# Patient Record
Sex: Male | Born: 1985 | Race: White | Hispanic: No | Marital: Single | Smoking: Former smoker
Health system: Southern US, Community
[De-identification: ages and names within clinical notes are randomized; demographics above are authoritative.]

## PROBLEM LIST (undated history)

## (undated) DIAGNOSIS — R569 Unspecified convulsions: Secondary | ICD-10-CM

---

## 2007-02-11 ENCOUNTER — Emergency Department (HOSPITAL_COMMUNITY): Admission: EM | Admit: 2007-02-11 | Discharge: 2007-02-11 | Payer: Self-pay | Admitting: Emergency Medicine

## 2009-11-02 ENCOUNTER — Inpatient Hospital Stay (HOSPITAL_COMMUNITY): Admission: EM | Admit: 2009-11-02 | Discharge: 2009-11-04 | Payer: Self-pay | Admitting: Emergency Medicine

## 2009-11-02 ENCOUNTER — Emergency Department (HOSPITAL_COMMUNITY): Admission: EM | Admit: 2009-11-02 | Discharge: 2009-11-02 | Payer: Self-pay | Admitting: Emergency Medicine

## 2010-03-25 ENCOUNTER — Emergency Department (HOSPITAL_COMMUNITY): Admission: EM | Admit: 2010-03-25 | Discharge: 2010-03-26 | Payer: Self-pay | Admitting: Emergency Medicine

## 2010-09-29 LAB — POCT I-STAT, CHEM 8
BUN: 6 mg/dL (ref 6–23)
Calcium, Ion: 0.98 mmol/L — ABNORMAL LOW (ref 1.12–1.32)
Chloride: 100 mEq/L (ref 96–112)
Creatinine, Ser: 1.2 mg/dL (ref 0.4–1.5)
HCT: 45 % (ref 39.0–52.0)
Potassium: 3.1 mEq/L — ABNORMAL LOW (ref 3.5–5.1)
TCO2: 23 mmol/L (ref 0–100)

## 2010-09-29 LAB — CBC
HCT: 40.4 % (ref 39.0–52.0)
Hemoglobin: 14.4 g/dL (ref 13.0–17.0)
MCHC: 35.6 g/dL (ref 30.0–36.0)
Platelets: 256 10*3/uL (ref 150–400)
RBC: 4.62 MIL/uL (ref 4.22–5.81)
WBC: 12.6 10*3/uL — ABNORMAL HIGH (ref 4.0–10.5)

## 2010-09-29 LAB — RAPID URINE DRUG SCREEN, HOSP PERFORMED
Amphetamines: NOT DETECTED
Benzodiazepines: NOT DETECTED
Cocaine: NOT DETECTED
Opiates: NOT DETECTED
Tetrahydrocannabinol: POSITIVE — AB

## 2010-09-29 LAB — COMPREHENSIVE METABOLIC PANEL
AST: 23 U/L (ref 0–37)
Alkaline Phosphatase: 93 U/L (ref 39–117)
Calcium: 8.7 mg/dL (ref 8.4–10.5)
Creatinine, Ser: 1.04 mg/dL (ref 0.4–1.5)
GFR calc Af Amer: >60 mL/min (ref >60)
Total Bilirubin: 0.7 mg/dL (ref 0.3–1.2)
Total Protein: 6.9 g/dL (ref 6.0–8.3)

## 2010-09-29 LAB — URINE MICROSCOPIC-ADD ON

## 2010-09-29 LAB — APTT: aPTT: 30 seconds (ref 24–37)

## 2010-09-29 LAB — PROTIME-INR: INR: 1.01 (ref 0.00–1.49)

## 2010-09-29 LAB — URINALYSIS, ROUTINE W REFLEX MICROSCOPIC
Bilirubin Urine: NEGATIVE
Ketones, ur: NEGATIVE mg/dL
Specific Gravity, Urine: 1.004 — ABNORMAL LOW (ref 1.005–1.030)

## 2010-09-29 LAB — ETHANOL: Alcohol, Ethyl (B): 222 mg/dL — ABNORMAL HIGH (ref 0–10)

## 2010-10-04 LAB — DIFFERENTIAL
Eosinophils Absolute: 0 10*3/uL (ref 0.0–0.7)
Monocytes Absolute: 1.2 10*3/uL — ABNORMAL HIGH (ref 0.1–1.0)
Monocytes Relative: 8 % (ref 3–12)
Neutrophils Relative %: 81 % — ABNORMAL HIGH (ref 43–77)

## 2010-10-04 LAB — HEPATIC FUNCTION PANEL
AST: 25 U/L (ref 0–37)
Albumin: 3.8 g/dL (ref 3.5–5.2)
Total Bilirubin: 0.7 mg/dL (ref 0.3–1.2)
Total Protein: 6.9 g/dL (ref 6.0–8.3)

## 2010-10-04 LAB — CBC
HCT: 40.3 % (ref 39.0–52.0)
Hemoglobin: 14.2 g/dL (ref 13.0–17.0)
MCV: 91 fL (ref 78.0–100.0)
RBC: 4.43 MIL/uL (ref 4.22–5.81)
WBC: 14.5 10*3/uL — ABNORMAL HIGH (ref 4.0–10.5)

## 2010-10-04 LAB — PROTIME-INR: INR: 1.07 (ref 0.00–1.49)

## 2010-10-04 LAB — ALDOSTERONE + RENIN ACTIVITY W/ RATIO: PRA LC/MS/MS: 2.73 ng/mL/h (ref 0.25–5.82)

## 2010-10-04 LAB — BASIC METABOLIC PANEL
GFR calc Af Amer: >60 mL/min (ref >60)
Glucose, Bld: 117 mg/dL — ABNORMAL HIGH (ref 70–99)
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
## 2011-05-01 LAB — URINALYSIS, ROUTINE W REFLEX MICROSCOPIC
Bilirubin Urine: NEGATIVE
Glucose, UA: NEGATIVE
Hgb urine dipstick: NEGATIVE
Ketones, ur: NEGATIVE
Nitrite: NEGATIVE
Protein, ur: NEGATIVE
Specific Gravity, Urine: 1.014
Urobilinogen, UA: 0.2
pH: 6

## 2014-01-17 ENCOUNTER — Emergency Department: Payer: Self-pay | Admitting: Emergency Medicine

## 2016-02-21 ENCOUNTER — Encounter (HOSPITAL_COMMUNITY): Payer: Self-pay | Admitting: Emergency Medicine

## 2016-02-21 ENCOUNTER — Emergency Department (HOSPITAL_COMMUNITY)
Admission: EM | Admit: 2016-02-21 | Discharge: 2016-02-21 | Disposition: A | Discharge status: Home / Self Care | Payer: Self-pay | Attending: Emergency Medicine | Admitting: Emergency Medicine

## 2016-02-21 DIAGNOSIS — Y999 Unspecified external cause status: Secondary | ICD-10-CM | POA: Insufficient documentation

## 2016-02-21 DIAGNOSIS — Z87891 Personal history of nicotine dependence: Secondary | ICD-10-CM | POA: Insufficient documentation

## 2016-02-21 DIAGNOSIS — Y939 Activity, unspecified: Secondary | ICD-10-CM | POA: Insufficient documentation

## 2016-02-21 DIAGNOSIS — Y929 Unspecified place or not applicable: Secondary | ICD-10-CM | POA: Insufficient documentation

## 2016-02-21 DIAGNOSIS — W57XXXA Bitten or stung by nonvenomous insect and other nonvenomous arthropods, initial encounter: Secondary | ICD-10-CM | POA: Insufficient documentation

## 2016-02-21 DIAGNOSIS — L03113 Cellulitis of right upper limb: Secondary | ICD-10-CM | POA: Insufficient documentation

## 2016-02-21 MED ORDER — IBUPROFEN 800 MG PO TABS: 800.0000 mg | ORAL_TABLET | Freq: Three times a day (TID) | ORAL | 0 refills | Status: DC

## 2016-02-21 MED ORDER — CEPHALEXIN 500 MG PO CAPS: 500.0000 mg | ORAL_CAPSULE | Freq: Four times a day (QID) | ORAL | 0 refills | Status: DC

## 2016-02-21 MED ORDER — SULFAMETHOXAZOLE-TRIMETHOPRIM 800-160 MG PO TABS: 1.0000 | ORAL_TABLET | Freq: Two times a day (BID) | ORAL | 0 refills | Status: AC

## 2016-02-21 NOTE — ED Triage Notes (Signed)
Patient complaining of abscess on right back of forearm. Patient does not know what bit him. He states this happened on Friday night. Patient arm is reddened with a black spot in the middle of the abscess.

## 2016-02-21 NOTE — Discharge Instructions (Signed)
Take the prescribed medication as directed. Keep wound clean and dry.  May wish to use warm compresses at home to help aid drainage/healing. Follow-up with your primary care doctor.  If you do not have one, you may wish to follow-up with the wellness clinic.  May call for appt. Return to the ED for new or worsening symptoms-- streaking up/down arm, high fever, worsening swelling, etc.

## 2016-02-21 NOTE — ED Provider Notes (Signed)
[facility]-EMERGENCY DEPT Provider Note   CSN: [id number] Arrival date & time: 02/21/16  4098  First Provider Contact:  First MD Initiated Contact with Patient 02/21/16 2037        History   Chief Complaint Chief Complaint  Patient presents with  . Abscess    HPI Daniel Terry is a 30 y.o. male.  The history is provided by the patient, the spouse and medical records.     30 year old male with no significant past medical history presenting to the ED for possible spider bite to his right forearm. He reports he felt this happened on Friday but did not see an insect directly. He states over the weekend. Has become increasingly more swollen and tender to the touch. He denies any fever or chills. States there has been some purulent drainage from the wound. He has no history of HIV, diabetes, or MRSA. He states he has been cleaning the area with peroxide and applying Neosporin without improvement.  History reviewed. No pertinent past medical history.  There are no active problems to display for this patient.   History reviewed. No pertinent surgical history.     Home Medications    Prior to Admission medications   Not on File    Family History History reviewed. No pertinent family history.  Social History Social History  Substance Use Topics  . Smoking status: Former Games developer  . Smokeless tobacco: Never Used  . Alcohol use No     Allergies   Review of patient's allergies indicates no known allergies.   Review of Systems Review of Systems  Skin: Positive for wound.  All other systems reviewed and are negative.    Physical Exam Updated Vital Signs BP 139/74 (BP Location: Right Arm)   Pulse 85   Temp 97.9 F (36.6 C) (Oral)   Resp 18   Ht  (1.753 m)   Wt 65.8 kg   SpO2 100%   BMI 21.41 kg/m   Physical Exam  Constitutional: He is oriented to person, place, and time. He appears well-developed and well-nourished.  HENT:  Head: Normocephalic and  atraumatic.  Mouth/Throat: Oropharynx is clear and moist.  Eyes: Conjunctivae and EOM are normal. Pupils are equal, round, and reactive to light.  Neck: Normal range of motion.  Cardiovascular: Normal rate, regular rhythm and normal heart sounds.   Pulmonary/Chest: Effort normal and breath sounds normal.  Abdominal: Soft. Bowel sounds are normal.  Musculoskeletal: Normal range of motion.  Right lower forearm with bug bite noted with small, central eschar; there is no fluctuance or active drainage; no bleeding; surrounding swelling, induration, and warmth to touch noted; no lymphangitis; full ROM of elbow, wrist, and all fingers; normal grip strength; normal sensation throughout  Neurological: He is alert and oriented to person, place, and time.  Skin: Skin is warm and dry.  Psychiatric: He has a normal mood and affect.  Nursing note and vitals reviewed.    ED Treatments / Results  Labs (all labs ordered are listed, but only abnormal results are displayed) Labs Reviewed - No data to display  EKG  EKG Interpretation None       Radiology No results found.  Procedures Procedures (including critical care time)  Medications Ordered in ED Medications - No data to display   Initial Impression / Assessment and Plan / ED Course  I have reviewed the triage vital signs and the nursing notes.  Pertinent labs & imaging results that were available during my care of  the patient were reviewed by me and considered in my medical decision making (see chart for details).  Clinical Course   30 year old male here with insect bite to right volar forearm with surrounding cellulitis. There is no active drainage or bleeding currently. Full range of motion of elbow, wrist, and all fingers.  Arm is neurovascularly intact. Do not clinically suspect septic joint.  Patient is afebrile and nontoxic. Will start on combination of Bactrim and Keflex. Encouraged to continue home wound care, monitor closely  at home for any worsening infection.  Discussed plan with patient, he acknowledged understanding and agreed with plan of care.  Return precautions given for new or worsening symptoms.  Final Clinical Impressions(s) / ED Diagnoses   Final diagnoses:  Insect bite  Cellulitis of right upper extremity    New Prescriptions Discharge Medication List as of 02/21/2016  9:08 PM    START taking these medications   Details  cephALEXin (KEFLEX) 500 MG capsule Take 1 capsule (500 mg total) by mouth 4 (four) times daily., Starting Mon 02/21/2016, Print    ibuprofen (ADVIL,MOTRIN) 800 MG tablet Take 1 tablet (800 mg total) by mouth 3 (three) times daily., Starting Mon 02/21/2016, Print    sulfamethoxazole-trimethoprim (BACTRIM DS,SEPTRA DS) 800-160 MG tablet Take 1 tablet by mouth 2 (two) times daily., Starting Mon 02/21/2016, Until Thu 03/02/2016, Print         Garlon HatchetLisa M Adrielle Polakowski, PA-C 02/21/16 2139    Lorre NickAnthony Allen, MD 02/25/16 1539

## 2016-02-21 NOTE — Progress Notes (Signed)
Pt stated he was bitten by something on his right under part of his arm.Pt does have a blackened scabbed area and warmth and reddness at the site.

## 2018-05-08 ENCOUNTER — Other Ambulatory Visit: Payer: Self-pay

## 2018-05-08 ENCOUNTER — Emergency Department: Payer: Self-pay

## 2018-05-08 ENCOUNTER — Emergency Department
Admission: EM | Admit: 2018-05-08 | Discharge: 2018-05-08 | Disposition: A | Discharge status: Home / Self Care | Payer: Self-pay | Attending: Emergency Medicine | Admitting: Emergency Medicine

## 2018-05-08 DIAGNOSIS — Z87891 Personal history of nicotine dependence: Secondary | ICD-10-CM | POA: Insufficient documentation

## 2018-05-08 DIAGNOSIS — R569 Unspecified convulsions: Secondary | ICD-10-CM | POA: Insufficient documentation

## 2018-05-08 LAB — COMPREHENSIVE METABOLIC PANEL
ALK PHOS: 75 U/L (ref 38–126)
ALT: 18 U/L (ref 0–44)
AST: 30 U/L (ref 15–41)
Albumin: 4.7 g/dL (ref 3.5–5.0)
Anion gap: 14 (ref 5–15)
BILIRUBIN TOTAL: 0.4 mg/dL (ref 0.3–1.2)
BUN: 15 mg/dL (ref 6–20)
CALCIUM: 8.8 mg/dL — AB (ref 8.9–10.3)
CO2: 22 mmol/L (ref 22–32)
Chloride: 104 mmol/L (ref 98–111)
Creatinine, Ser: 0.97 mg/dL (ref 0.61–1.24)
GFR calc Af Amer: >60 mL/min (ref >60)
GLUCOSE: 133 mg/dL — AB (ref 70–99)
Potassium: 3.6 mmol/L (ref 3.5–5.1)
Sodium: 140 mmol/L (ref 135–145)
TOTAL PROTEIN: 7.6 g/dL (ref 6.5–8.1)

## 2018-05-08 LAB — CBC WITH DIFFERENTIAL/PLATELET
Abs Immature Granulocytes: 0.05 10*3/uL (ref 0.00–0.07)
Basophils Absolute: 0.1 10*3/uL (ref 0.0–0.1)
Basophils Relative: 1 %
EOS ABS: 0.4 10*3/uL (ref 0.0–0.5)
EOS PCT: 5 %
HCT: 42.4 % (ref 39.0–52.0)
HEMOGLOBIN: 14.6 g/dL (ref 13.0–17.0)
Immature Granulocytes: 1 %
LYMPHS ABS: 3.4 10*3/uL (ref 0.7–4.0)
LYMPHS PCT: 35 %
MCH: 30.6 pg (ref 26.0–34.0)
MCHC: 34.4 g/dL (ref 30.0–36.0)
MCV: 88.9 fL (ref 80.0–100.0)
MONO ABS: 0.7 10*3/uL (ref 0.1–1.0)
MONOS PCT: 8 %
NRBC: 0 % (ref 0.0–0.2)
Neutro Abs: 4.9 10*3/uL (ref 1.7–7.7)
Neutrophils Relative %: 50 %
Platelets: 307 10*3/uL (ref 150–400)
RBC: 4.77 MIL/uL (ref 4.22–5.81)
RDW: 12.2 % (ref 11.5–15.5)
WBC: 9.6 10*3/uL (ref 4.0–10.5)

## 2018-05-08 LAB — URINE DRUG SCREEN, QUALITATIVE (ARMC ONLY)
Amphetamines, Ur Screen: NOT DETECTED
BARBITURATES, UR SCREEN: NOT DETECTED
Benzodiazepine, Ur Scrn: NOT DETECTED
CANNABINOID 50 NG, UR ~~LOC~~: POSITIVE — AB
COCAINE METABOLITE, UR ~~LOC~~: NOT DETECTED
MDMA (ECSTASY) UR SCREEN: NOT DETECTED
METHADONE SCREEN, URINE: NOT DETECTED
OPIATE, UR SCREEN: POSITIVE — AB
Phencyclidine (PCP) Ur S: NOT DETECTED
TRICYCLIC, UR SCREEN: NOT DETECTED

## 2018-05-08 LAB — URINALYSIS, ROUTINE W REFLEX MICROSCOPIC
BACTERIA UA: NONE SEEN
Bilirubin Urine: NEGATIVE
Glucose, UA: NEGATIVE mg/dL
Hgb urine dipstick: NEGATIVE
KETONES UR: NEGATIVE mg/dL
LEUKOCYTES UA: NEGATIVE
Nitrite: NEGATIVE
PROTEIN: 30 mg/dL — AB
SQUAMOUS EPITHELIAL / LPF: NONE SEEN (ref 0–5)
Specific Gravity, Urine: 1.018 (ref 1.005–1.030)
pH: 6 (ref 5.0–8.0)

## 2018-05-08 LAB — ACETAMINOPHEN LEVEL

## 2018-05-08 LAB — SALICYLATE LEVEL: Salicylate Lvl: <7 mg/dL (ref 2.8–30.0)

## 2018-05-08 LAB — LACTIC ACID, PLASMA
Lactic Acid, Venous: 4.7 mmol/L (ref 0.5–1.9)
Lactic Acid, Venous: 1.2 mmol/L (ref 0.5–1.9)

## 2018-05-08 LAB — GLUCOSE, CAPILLARY: Glucose-Capillary: 116 mg/dL — ABNORMAL HIGH (ref 70–99)

## 2018-05-08 LAB — MAGNESIUM: MAGNESIUM: 2 mg/dL (ref 1.7–2.4)

## 2018-05-08 LAB — ETHANOL: Alcohol, Ethyl (B): <10 mg/dL (ref <10)

## 2018-05-08 MED ORDER — ONDANSETRON HCL 4 MG/2ML IJ SOLN
4.0000 mg | INTRAMUSCULAR | Status: AC
Start: 2018-05-08 — End: 2018-05-08
  Administered 2018-05-08: 4 mg via INTRAVENOUS

## 2018-05-08 MED ORDER — SODIUM CHLORIDE 0.9 % IV BOLUS: 1000.0000 mL | Freq: Once | INTRAVENOUS | Status: DC

## 2018-05-08 MED ORDER — MORPHINE SULFATE (PF) 4 MG/ML IV SOLN
INTRAVENOUS | Status: AC
  Filled 2018-05-08: qty 1

## 2018-05-08 MED ORDER — ONDANSETRON HCL 4 MG/2ML IJ SOLN
INTRAMUSCULAR | Status: AC
  Filled 2018-05-08: qty 2

## 2018-05-08 MED ORDER — MORPHINE SULFATE (PF) 4 MG/ML IV SOLN
4.0000 mg | Freq: Once | INTRAVENOUS | Status: AC
  Administered 2018-05-08: 4 mg via INTRAVENOUS
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
## 2018-05-08 NOTE — ED Notes (Signed)
Patient complaining of headache and clutching forehead. Patient denies drug or alcohol abuse and is now alert and oriented.

## 2018-05-08 NOTE — ED Notes (Signed)
Patient's Father Daniel Terry); [phone]

## 2018-05-08 NOTE — Discharge Instructions (Signed)
You might have had a seizure. No driving until cleared by neurology   Call neurology for appointment in a week   Avoid using marijuana or alcohol   Return to ER if you have another seizure, lethargy

## 2018-05-08 NOTE — ED Triage Notes (Signed)
Patient coming in from home via [facility] that gf called because patient was having seizure like activity in bed. EMS states he was running around house and has no hx of seizures that they know of. Oriented to self and place.

## 2019-06-04 ENCOUNTER — Emergency Department (HOSPITAL_COMMUNITY)
Admission: EM | Admit: 2019-06-04 | Discharge: 2019-06-04 | Disposition: A | Discharge status: Home / Self Care | Payer: Self-pay | Attending: Emergency Medicine | Admitting: Emergency Medicine

## 2019-06-04 ENCOUNTER — Encounter (HOSPITAL_COMMUNITY): Payer: Self-pay

## 2019-06-04 DIAGNOSIS — Z87891 Personal history of nicotine dependence: Secondary | ICD-10-CM | POA: Insufficient documentation

## 2019-06-04 DIAGNOSIS — R569 Unspecified convulsions: Secondary | ICD-10-CM | POA: Insufficient documentation

## 2019-06-04 LAB — BASIC METABOLIC PANEL
Anion gap: 9 (ref 5–15)
BUN: 12 mg/dL (ref 6–20)
CO2: 24 mmol/L (ref 22–32)
Calcium: 9.1 mg/dL (ref 8.9–10.3)
Chloride: 102 mmol/L (ref 98–111)
Creatinine, Ser: 1.14 mg/dL (ref 0.61–1.24)
GFR calc Af Amer: >60 mL/min (ref >60)
GFR calc non Af Amer: >60 mL/min (ref >60)
Glucose, Bld: 161 mg/dL — ABNORMAL HIGH (ref 70–99)
Potassium: 3.4 mmol/L — ABNORMAL LOW (ref 3.5–5.1)
Sodium: 135 mmol/L (ref 135–145)

## 2019-06-04 LAB — CBC WITH DIFFERENTIAL/PLATELET
Abs Immature Granulocytes: 0.08 10*3/uL — ABNORMAL HIGH (ref 0.00–0.07)
Basophils Absolute: 0.1 10*3/uL (ref 0.0–0.1)
Basophils Relative: 1 %
Eosinophils Absolute: 0.1 10*3/uL (ref 0.0–0.5)
Eosinophils Relative: 1 %
HCT: 44.4 % (ref 39.0–52.0)
Hemoglobin: 14.6 g/dL (ref 13.0–17.0)
Immature Granulocytes: 1 %
Lymphocytes Relative: 6 %
Lymphs Abs: 1 10*3/uL (ref 0.7–4.0)
MCH: 30.4 pg (ref 26.0–34.0)
MCHC: 32.9 g/dL (ref 30.0–36.0)
MCV: 92.5 fL (ref 80.0–100.0)
Monocytes Absolute: 0.7 10*3/uL (ref 0.1–1.0)
Monocytes Relative: 5 %
Neutro Abs: 13.4 10*3/uL — ABNORMAL HIGH (ref 1.7–7.7)
Neutrophils Relative %: 86 %
Platelets: 325 10*3/uL (ref 150–400)
RBC: 4.8 MIL/uL (ref 4.22–5.81)
RDW: 12.4 % (ref 11.5–15.5)
WBC: 15.3 10*3/uL — ABNORMAL HIGH (ref 4.0–10.5)
nRBC: 0 % (ref 0.0–0.2)

## 2019-06-04 LAB — CBG MONITORING, ED: Glucose-Capillary: 152 mg/dL — ABNORMAL HIGH (ref 70–99)

## 2019-06-04 NOTE — ED Provider Notes (Signed)
[place] DEPT Provider Note   CSN: [id number] Arrival date & time: 06/04/19  5366     History   Chief Complaint seizure  HPI Daniel Terry is a 33 y.o. male.     HPI Pt presents to the ED after a seizure.  Pt remembers going to bed last night.   _Pt does not recall what happened.  EMS reports girlfriend noted grand mal seizure.  Pt denies any complaints right now other than being thirsty.  Pt states he did do some "powder" recently.  Drinks alcohol a few times per week.  Denies excessive use.  Prior history of seizures.  Was referred to a neurologist but did not make an appointment.  No seizure in last year. History reviewed. No pertinent past medical history.  There are no active problems to display for this patient.   History reviewed. No pertinent surgical history.      Home Medications    Prior to Admission medications   Not on File    Family History No family history on file.  Social History Social History   Tobacco Use  . Smoking status: Former Research scientist (life sciences)  . Smokeless tobacco: Never Used  Substance Use Topics  . Alcohol use: No  . Drug use: Not on file     Allergies   Patient has no known allergies.   Review of Systems Review of Systems  Constitutional: Negative for fever.  Respiratory: Negative for shortness of breath.   Cardiovascular: Negative for chest pain.  Gastrointestinal: Negative for abdominal pain.  Neurological: Positive for headaches ( mild). Negative for speech difficulty and weakness.  All other systems reviewed and are negative.    Physical Exam Updated Vital Signs BP (!) 156/92   Pulse 92   Temp 97.7 F (36.5 C)   Resp 20   Ht 1.753 m (5\' 9" )   Wt 63.5 kg   SpO2 100%   BMI 20.67 kg/m   Physical Exam Vitals signs and nursing note reviewed.  Constitutional:      General: He is not in acute distress.    Appearance: He is well-developed.  HENT:     Head: Normocephalic and  atraumatic.     Right Ear: External ear normal.     Left Ear: External ear normal.  Eyes:     General: No scleral icterus.       Right eye: No discharge.        Left eye: No discharge.     Conjunctiva/sclera: Conjunctivae normal.  Neck:     Musculoskeletal: Neck supple.     Trachea: No tracheal deviation.  Cardiovascular:     Rate and Rhythm: Normal rate and regular rhythm.  Pulmonary:     Effort: Pulmonary effort is normal. No respiratory distress.     Breath sounds: Normal breath sounds. No stridor. No wheezing or rales.  Abdominal:     General: Bowel sounds are normal. There is no distension.     Palpations: Abdomen is soft.     Tenderness: There is no abdominal tenderness. There is no guarding or rebound.  Musculoskeletal:        General: No tenderness.  Skin:    General: Skin is warm and dry.     Findings: No rash.  Neurological:     Mental Status: He is alert and oriented to person, place, and time.     Cranial Nerves: No cranial nerve deficit (no facial droop, extraocular movements intact, no slurred speech).  Sensory: No sensory deficit.     Motor: No abnormal muscle tone or seizure activity.     Coordination: Coordination normal.     Comments: No pronator drift bilateral upper extrem, able to hold both legs off bed for 5 seconds, sensation intact in all extremities, no visual field cuts, no left or right sided neglect,  no nystagmus noted       ED Treatments / Results  Labs (all labs ordered are listed, but only abnormal results are displayed) Labs Reviewed  BASIC METABOLIC PANEL - Abnormal; Notable for the following components:      Result Value   Potassium 3.4 (*)    Glucose, Bld 161 (*)    All other components within normal limits  CBC WITH DIFFERENTIAL/PLATELET - Abnormal; Notable for the following components:   WBC 15.3 (*)    Neutro Abs 13.4 (*)    Abs Immature Granulocytes 0.08 (*)    All other components within normal limits  CBG MONITORING, ED -  Abnormal; Notable for the following components:   Glucose-Capillary 152 (*)    All other components within normal limits    EKG None  Radiology No results found.  Procedures Procedures (including critical care time)  Medications Ordered in ED Medications - No data to display   Initial Impression / Assessment and Plan / ED Course  I have reviewed the triage vital signs and the nursing notes.  Pertinent labs & imaging results that were available during my care of the patient were reviewed by me and considered in my medical decision making (see chart for details).  Clinical Course as of Jun 03 1038  Wed Jun 04, 2019  1039 Patient was monitored in the ED.  No recurrent seizures.  He is at his mental baseline.   [JK]    Clinical Course User Index [JK] Linwood Dibbles, MD     Patient presented to the ED after seizure.  Patient does admit to recent drug use last evening.  In the past this did precede his prior seizure as well.  Patient is at his mental baseline.  No neurologic abnormalities.  I doubt hemorrhage, tumor or infection.  I do think the patient would benefit from outpatient neurology follow-up.  Discussed avoiding being at heights or driving or any other dangerous activities where if he had a seizure he could be injured.  Outpatient follow-up discussed with the patient as well as his significant other.  Final Clinical Impressions(s) / ED Diagnoses   Final diagnoses:  Seizure [facility])    ED Discharge Orders    None       Linwood Dibbles, MD 06/04/19 1040

## 2019-06-04 NOTE — ED Triage Notes (Addendum)
Per EMS: Pt from home.  Pt's girlfriend called stating pt had a grand-mal like seizure.  Pt does not remember event.  Pt states he has had one seizure previous to this event.  No pt hx of seizures. Pt given 4 mg zofran IV 20 gauge LAC BP 138/91 CBG 147 HR 97 O2 96%

## 2019-06-04 NOTE — Discharge Instructions (Addendum)
Follow up with a neurologist for further evaluation as we discussed.  Avoid driving, being at heights until being evaluated by a neurologist

## 2019-11-21 ENCOUNTER — Other Ambulatory Visit: Payer: Self-pay

## 2019-11-21 ENCOUNTER — Observation Stay (HOSPITAL_COMMUNITY): Payer: Self-pay

## 2019-11-21 ENCOUNTER — Encounter (HOSPITAL_COMMUNITY): Payer: Self-pay | Admitting: Internal Medicine

## 2019-11-21 ENCOUNTER — Observation Stay (HOSPITAL_COMMUNITY)
Admission: EM | Admit: 2019-11-21 | Discharge: 2019-11-22 | Disposition: A | Discharge status: Home / Self Care | Payer: Self-pay | Attending: Internal Medicine | Admitting: Internal Medicine

## 2019-11-21 DIAGNOSIS — R569 Unspecified convulsions: Principal | ICD-10-CM

## 2019-11-21 DIAGNOSIS — Z87891 Personal history of nicotine dependence: Secondary | ICD-10-CM | POA: Insufficient documentation

## 2019-11-21 DIAGNOSIS — F10939 Alcohol use, unspecified with withdrawal, unspecified: Secondary | ICD-10-CM

## 2019-11-21 DIAGNOSIS — F1020 Alcohol dependence, uncomplicated: Secondary | ICD-10-CM | POA: Insufficient documentation

## 2019-11-21 DIAGNOSIS — F10239 Alcohol dependence with withdrawal, unspecified: Secondary | ICD-10-CM

## 2019-11-21 DIAGNOSIS — Z20822 Contact with and (suspected) exposure to covid-19: Secondary | ICD-10-CM | POA: Insufficient documentation

## 2019-11-21 LAB — HIV ANTIBODY (ROUTINE TESTING W REFLEX): HIV Screen 4th Generation wRfx: NONREACTIVE

## 2019-11-21 LAB — RESPIRATORY PANEL BY RT PCR (FLU A&B, COVID)
Influenza A by PCR: NEGATIVE
Influenza B by PCR: NEGATIVE
SARS Coronavirus 2 by RT PCR: NEGATIVE

## 2019-11-21 LAB — RAPID URINE DRUG SCREEN, HOSP PERFORMED
Amphetamines: NOT DETECTED
Barbiturates: NOT DETECTED
Benzodiazepines: NOT DETECTED
Cocaine: NOT DETECTED
Opiates: NOT DETECTED
Tetrahydrocannabinol: POSITIVE — AB

## 2019-11-21 LAB — URINALYSIS, ROUTINE W REFLEX MICROSCOPIC
Bilirubin Urine: NEGATIVE
Glucose, UA: NEGATIVE mg/dL
Ketones, ur: NEGATIVE mg/dL
Leukocytes,Ua: NEGATIVE
Nitrite: NEGATIVE
Protein, ur: 100 mg/dL — AB
Specific Gravity, Urine: 1.017 (ref 1.005–1.030)
pH: 5 (ref 5.0–8.0)

## 2019-11-21 LAB — CBG MONITORING, ED: Glucose-Capillary: 141 mg/dL — ABNORMAL HIGH (ref 70–99)

## 2019-11-21 LAB — BASIC METABOLIC PANEL
Anion gap: 12 (ref 5–15)
BUN: 10 mg/dL (ref 6–20)
CO2: 23 mmol/L (ref 22–32)
Calcium: 9.2 mg/dL (ref 8.9–10.3)
Chloride: 103 mmol/L (ref 98–111)
Creatinine, Ser: 1.21 mg/dL (ref 0.61–1.24)
GFR calc Af Amer: >60 mL/min (ref >60)
GFR calc non Af Amer: >60 mL/min (ref >60)
Glucose, Bld: 135 mg/dL — ABNORMAL HIGH (ref 70–99)
Potassium: 3.8 mmol/L (ref 3.5–5.1)
Sodium: 138 mmol/L (ref 135–145)

## 2019-11-21 LAB — CBC
HCT: 46 % (ref 39.0–52.0)
Hemoglobin: 15.7 g/dL (ref 13.0–17.0)
MCH: 31 pg (ref 26.0–34.0)
MCHC: 34.1 g/dL (ref 30.0–36.0)
MCV: 90.9 fL (ref 80.0–100.0)
Platelets: 321 10*3/uL (ref 150–400)
RBC: 5.06 MIL/uL (ref 4.22–5.81)
RDW: 12.2 % (ref 11.5–15.5)
WBC: 14.5 10*3/uL — ABNORMAL HIGH (ref 4.0–10.5)
nRBC: 0 % (ref 0.0–0.2)

## 2019-11-21 LAB — TSH: TSH: 1.471 u[IU]/mL (ref 0.350–4.500)

## 2019-11-21 LAB — ETHANOL: Alcohol, Ethyl (B): <10 mg/dL (ref <10)

## 2019-11-21 LAB — MAGNESIUM: Magnesium: 2.5 mg/dL — ABNORMAL HIGH (ref 1.7–2.4)

## 2019-11-21 LAB — CK: Total CK: 135 U/L (ref 49–397)

## 2019-11-21 LAB — PHOSPHORUS: Phosphorus: 1.3 mg/dL — ABNORMAL LOW (ref 2.5–4.6)

## 2019-11-21 MED ORDER — ENOXAPARIN SODIUM 40 MG/0.4ML ~~LOC~~ SOLN
40.0000 mg | SUBCUTANEOUS | Status: DC
  Administered 2019-11-21: 40 mg via SUBCUTANEOUS
  Filled 2019-11-21: qty 0.4

## 2019-11-21 MED ORDER — SODIUM CHLORIDE 0.9 % IV SOLN
2000.0000 mg | Freq: Once | INTRAVENOUS | Status: AC
  Administered 2019-11-21: 2000 mg via INTRAVENOUS
  Filled 2019-11-21: qty 20

## 2019-11-21 MED ORDER — SODIUM CHLORIDE 0.9 % IV SOLN
75.0000 mL/h | INTRAVENOUS | Status: DC
  Administered 2019-11-21: 75 mL/h via INTRAVENOUS

## 2019-11-21 MED ORDER — THIAMINE HCL 100 MG/ML IJ SOLN
100.0000 mg | Freq: Every day | INTRAMUSCULAR | Status: DC
  Administered 2019-11-21: 17:00:00 100 mg via INTRAVENOUS
  Filled 2019-11-21: qty 2

## 2019-11-21 MED ORDER — LORAZEPAM 2 MG/ML IJ SOLN
1.0000 mg | INTRAMUSCULAR | Status: DC | PRN
  Administered 2019-11-21: 2 mg via INTRAVENOUS

## 2019-11-21 MED ORDER — LORAZEPAM 1 MG PO TABS: 1.0000 mg | ORAL_TABLET | ORAL | Status: DC | PRN

## 2019-11-21 MED ORDER — LORAZEPAM 2 MG/ML IJ SOLN
1.0000 mg | INTRAMUSCULAR | Status: DC | PRN
  Filled 2019-11-21: qty 1

## 2019-11-21 MED ORDER — ADULT MULTIVITAMIN W/MINERALS CH
1.0000 | ORAL_TABLET | Freq: Every day | ORAL | Status: DC
  Administered 2019-11-22: 1 via ORAL
  Filled 2019-11-21: qty 1

## 2019-11-21 MED ORDER — LORAZEPAM 2 MG/ML IJ SOLN
1.0000 mg | Freq: Once | INTRAMUSCULAR | Status: AC
  Administered 2019-11-21: 12:00:00 1 mg via INTRAVENOUS
  Filled 2019-11-21: qty 1

## 2019-11-21 MED ORDER — THIAMINE HCL 100 MG PO TABS
100.0000 mg | ORAL_TABLET | Freq: Every day | ORAL | Status: DC
  Administered 2019-11-22: 10:00:00 100 mg via ORAL
  Filled 2019-11-21: qty 1

## 2019-11-21 MED ORDER — LEVETIRACETAM 500 MG PO TABS
500.0000 mg | ORAL_TABLET | Freq: Two times a day (BID) | ORAL | Status: DC
  Administered 2019-11-22: 500 mg via ORAL
  Filled 2019-11-21: qty 1

## 2019-11-21 MED ORDER — FOLIC ACID 1 MG PO TABS
1.0000 mg | ORAL_TABLET | Freq: Every day | ORAL | Status: DC
  Administered 2019-11-22: 1 mg via ORAL
  Filled 2019-11-21: qty 1

## 2019-11-21 MED ORDER — LORAZEPAM 2 MG/ML IJ SOLN
INTRAMUSCULAR | Status: AC
  Administered 2019-11-21: 1 mg via INTRAVENOUS
  Filled 2019-11-21: qty 1

## 2019-11-21 MED ORDER — LIDOCAINE VISCOUS HCL 2 % MT SOLN
15.0000 mL | OROMUCOSAL | Status: DC | PRN
  Filled 2019-11-21: qty 15

## 2019-11-21 NOTE — ED Notes (Signed)
Pt returned from MRI at this time. Placed pt back on monitor, pt placed in position of comfort and advised of wait status.   
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
## 2019-11-21 NOTE — ED Notes (Signed)
tammy Sky, mother- [phone]- would like a pt update.
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
## 2019-11-21 NOTE — Procedures (Signed)
Patient Name: CHEICK SUHR  MRN: [id number]  Epilepsy Attending: Charlsie Quest  Referring Physician/Provider: Dr Lenward Chancellor Date: 11/21/2019 Duration: 23.44 mins  Patient history: 34yo M with h/o alcohol use presented with multiple seizure like episodes.   Level of alertness: awake, asleep  AEDs during EEG study: LEV, Lorazepam  Technical aspects: This EEG study was done with scalp electrodes positioned according to the 10-20 International system of electrode placement. Electrical activity was acquired at a sampling rate of 500Hz  and reviewed with a high frequency filter of 70Hz  and a low frequency filter of 1Hz . EEG data were recorded continuously and digitally stored.   DESCRIPTION: The posterior dominant rhythm consists of 8-9 Hz activity of moderate voltage (25-35 uV) seen predominantly in posterior head regions, symmetric and reactive to eye opening and eye closing. Sleep was characterized by vertex waves, sleep spindles (12-14hz ), maximal frontocentral region. Hyperventilation and photic stimulation were not performed.  IMPRESSION: This study is within normal limits. No seizures or epileptiform discharges were seen throughout the recording.  Kynley Metzger 

## 2019-11-21 NOTE — ED Notes (Signed)
Pt transported to MRI at this time 

## 2019-11-21 NOTE — Progress Notes (Signed)
EEG complete - results pending 

## 2019-11-21 NOTE — Progress Notes (Signed)
Patient was not able to continue with the rest of his MRI exam. I heard yelling about halfway through and the patient has climbed out of the scanner while the exam was going. I told him He stated he cannot take it anymore and needs to get out of here. Limited MRI exam. Sent over the images that I did end up getting. If MD wants further imaging, patient will need to be medicated before coming down.

## 2019-11-21 NOTE — ED Notes (Addendum)
States he feels sx every morning, of anxiety and increased stress, sweats and dizziness.  Denies any N/V in AM.  Pt calm and pleasant.

## 2019-11-21 NOTE — ED Triage Notes (Signed)
Reportedly x 4 seizures witnessed by GF over the last 24 hours, generalized shaking lasting only a minute.  Has had seizures in the past and has no official diagnosis or on any meds.  Pt denies any incontinence, did bite tongue during the episodes.  Alert and oriented on arrival.  Does admit to ETOH use at least every other day, 12 beers.  Last intake 2 days ago, and states he drank all day.  Does have lower back pain at a "5", no other injuries noted.  Pt has had referral to a Neurologist 1 month ago when he had his last episode, but did not follow through.

## 2019-11-22 LAB — COMPREHENSIVE METABOLIC PANEL
ALT: 22 U/L (ref 0–44)
AST: 24 U/L (ref 15–41)
Albumin: 3.6 g/dL (ref 3.5–5.0)
Alkaline Phosphatase: 80 U/L (ref 38–126)
Anion gap: 10 (ref 5–15)
BUN: 8 mg/dL (ref 6–20)
CO2: 24 mmol/L (ref 22–32)
Calcium: 8.7 mg/dL — ABNORMAL LOW (ref 8.9–10.3)
Chloride: 103 mmol/L (ref 98–111)
Creatinine, Ser: 1.16 mg/dL (ref 0.61–1.24)
GFR calc Af Amer: >60 mL/min (ref >60)
GFR calc non Af Amer: >60 mL/min (ref >60)
Glucose, Bld: 100 mg/dL — ABNORMAL HIGH (ref 70–99)
Potassium: 3.3 mmol/L — ABNORMAL LOW (ref 3.5–5.1)
Sodium: 137 mmol/L (ref 135–145)
Total Bilirubin: 1.3 mg/dL — ABNORMAL HIGH (ref 0.3–1.2)
Total Protein: 6.7 g/dL (ref 6.5–8.1)

## 2019-11-22 LAB — CBC
HCT: 41.2 % (ref 39.0–52.0)
Hemoglobin: 14.3 g/dL (ref 13.0–17.0)
MCH: 31.5 pg (ref 26.0–34.0)
MCHC: 34.7 g/dL (ref 30.0–36.0)
MCV: 90.7 fL (ref 80.0–100.0)
Platelets: 279 10*3/uL (ref 150–400)
RBC: 4.54 MIL/uL (ref 4.22–5.81)
RDW: 12.4 % (ref 11.5–15.5)
WBC: 13.6 10*3/uL — ABNORMAL HIGH (ref 4.0–10.5)
nRBC: 0 % (ref 0.0–0.2)

## 2019-11-22 MED ORDER — LIDOCAINE VISCOUS HCL 2 % MT SOLN: 15.0000 mL | OROMUCOSAL | 0 refills | Status: DC | PRN

## 2019-11-22 MED ORDER — NALTREXONE HCL 50 MG PO TABS
50.0000 mg | ORAL_TABLET | Freq: Every day | ORAL | 1 refills | Status: DC
Start: 2019-11-22 — End: 2020-08-07

## 2019-11-22 MED ORDER — LEVETIRACETAM 500 MG PO TABS: 500.0000 mg | ORAL_TABLET | Freq: Two times a day (BID) | ORAL | 1 refills | Status: DC

## 2019-11-22 MED ORDER — POTASSIUM CHLORIDE CRYS ER 20 MEQ PO TBCR
30.0000 meq | EXTENDED_RELEASE_TABLET | Freq: Two times a day (BID) | ORAL | Status: DC
  Administered 2019-11-22: 30 meq via ORAL
  Filled 2019-11-22: qty 1

## 2019-11-22 MED ORDER — K PHOS MONO-SOD PHOS DI & MONO 155-852-130 MG PO TABS
500.0000 mg | ORAL_TABLET | ORAL | Status: DC
  Administered 2019-11-22: 500 mg via ORAL
  Filled 2019-11-22 (×4): qty 2

## 2019-11-22 NOTE — TOC Initial Note (Signed)
Transition of Care [facility]) - Initial/Assessment Note    Patient Details  Name: Daniel Terry MRN: [id number] Date of Birth: 1986/01/28  Transition of Care [facility]) CM/SW Contact:    Lawerance Sabal, RN Phone Number: 11/22/2019, 11:39 AM  Clinical Narrative:              Daniel Terry w patient and girlfriend at bedside. Discussed plan for home meds.  Patient to get meds filled at [facility]. He is going to sign up for the Rx Saving Program (managed through [employer]). We reviewed the cost of this. He will also use [employer]. App for [employer], and [employer] Rx saving plan sent to girlfriend's phone at [phone]. Both verbalized understanding of plan and were grateful for help.  IM clinic on AVS for them to call Terry to schedule follow up.            Patient Goals and CMS Choice        Expected Discharge Plan and Services           Expected Discharge Date: 11/22/19                                    Prior Living Arrangements/Services                       Activities of Daily Living Home Assistive Devices/Equipment: None ADL Screening (condition at time of admission) Patient's cognitive ability adequate to safely complete daily activities?: Yes Is the patient deaf or have difficulty hearing?: No Does the patient have difficulty seeing, even when wearing glasses/contacts?: No Does the patient have difficulty concentrating, remembering, or making decisions?: No Patient able to express need for assistance with ADLs?: Yes Does the patient have difficulty dressing or bathing?: No Independently performs ADLs?: Yes (appropriate for developmental age) Does the patient have difficulty walking or climbing stairs?: No Weakness of Legs: None Weakness of Arms/Hands: None  Permission Sought/Granted                  Emotional Assessment              Admission diagnosis:  Seizure [facility]) [R56.9] Patient Active Problem List   Diagnosis Date Noted  . Seizure (HCC)  11/21/2019   PCP:  Patient, No Pcp Per Pharmacy:   [facility] [phone] Hassell Halim [address] [place] [address] Phone: [phone] Fax: [phone]  [facility] [place] [address] [place] [address] [address] [place] [address] Phone: [phone] Fax: [phone]  [facility] [address] [place] - [address] [place] [address] Phone: [phone] Fax: [phone]     Social Determinants of Health (SDOH) Interventions    Readmission Risk Interventions No flowsheet data found.
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
## 2019-12-08 ENCOUNTER — Telehealth: Payer: Self-pay | Admitting: *Deleted

## 2019-12-08 ENCOUNTER — Ambulatory Visit: Payer: Self-pay

## 2019-12-08 NOTE — Telephone Encounter (Signed)
Call to patient.  Unable to leave message on patient's voice mail.  Angelina Ok, RN 12/08/2019 3:18 PM.

## 2020-02-15 ENCOUNTER — Emergency Department (HOSPITAL_COMMUNITY): Payer: Self-pay

## 2020-02-15 ENCOUNTER — Emergency Department (HOSPITAL_COMMUNITY)
Admission: EM | Admit: 2020-02-15 | Discharge: 2020-02-15 | Disposition: A | Discharge status: Home / Self Care | Payer: Self-pay | Attending: Emergency Medicine | Admitting: Emergency Medicine

## 2020-02-15 ENCOUNTER — Encounter (HOSPITAL_COMMUNITY): Payer: Self-pay

## 2020-02-15 DIAGNOSIS — Z87891 Personal history of nicotine dependence: Secondary | ICD-10-CM | POA: Insufficient documentation

## 2020-02-15 DIAGNOSIS — R569 Unspecified convulsions: Secondary | ICD-10-CM | POA: Insufficient documentation

## 2020-02-15 LAB — COMPREHENSIVE METABOLIC PANEL
ALT: 26 U/L (ref 0–44)
AST: 34 U/L (ref 15–41)
Albumin: 4.8 g/dL (ref 3.5–5.0)
Alkaline Phosphatase: 89 U/L (ref 38–126)
Anion gap: 9 (ref 5–15)
BUN: 12 mg/dL (ref 6–20)
CO2: 28 mmol/L (ref 22–32)
Calcium: 8.8 mg/dL — ABNORMAL LOW (ref 8.9–10.3)
Chloride: 99 mmol/L (ref 98–111)
Creatinine, Ser: 0.88 mg/dL (ref 0.61–1.24)
GFR calc Af Amer: >60 mL/min (ref >60)
GFR calc non Af Amer: >60 mL/min (ref >60)
Glucose, Bld: 103 mg/dL — ABNORMAL HIGH (ref 70–99)
Potassium: 3.8 mmol/L (ref 3.5–5.1)
Sodium: 136 mmol/L (ref 135–145)
Total Bilirubin: 0.9 mg/dL (ref 0.3–1.2)
Total Protein: 8 g/dL (ref 6.5–8.1)

## 2020-02-15 LAB — CBC WITH DIFFERENTIAL/PLATELET
Abs Immature Granulocytes: 0.05 10*3/uL (ref 0.00–0.07)
Basophils Absolute: 0.1 10*3/uL (ref 0.0–0.1)
Basophils Relative: 0 %
Eosinophils Absolute: 0 10*3/uL (ref 0.0–0.5)
Eosinophils Relative: 0 %
HCT: 40.8 % (ref 39.0–52.0)
Hemoglobin: 13.9 g/dL (ref 13.0–17.0)
Immature Granulocytes: 0 %
Lymphocytes Relative: 7 %
Lymphs Abs: 1 10*3/uL (ref 0.7–4.0)
MCH: 30.8 pg (ref 26.0–34.0)
MCHC: 34.1 g/dL (ref 30.0–36.0)
MCV: 90.3 fL (ref 80.0–100.0)
Monocytes Absolute: 0.9 10*3/uL (ref 0.1–1.0)
Monocytes Relative: 6 %
Neutro Abs: 12.4 10*3/uL — ABNORMAL HIGH (ref 1.7–7.7)
Neutrophils Relative %: 87 %
Platelets: 257 10*3/uL (ref 150–400)
RBC: 4.52 MIL/uL (ref 4.22–5.81)
RDW: 12.5 % (ref 11.5–15.5)
WBC: 14.4 10*3/uL — ABNORMAL HIGH (ref 4.0–10.5)
nRBC: 0 % (ref 0.0–0.2)

## 2020-02-15 LAB — RAPID URINE DRUG SCREEN, HOSP PERFORMED
Amphetamines: NOT DETECTED
Barbiturates: NOT DETECTED
Benzodiazepines: NOT DETECTED
Cocaine: NOT DETECTED
Opiates: NOT DETECTED
Tetrahydrocannabinol: POSITIVE — AB

## 2020-02-15 LAB — ETHANOL: Alcohol, Ethyl (B): <10 mg/dL (ref <10)

## 2020-02-15 LAB — LACTIC ACID, PLASMA: Lactic Acid, Venous: 1.4 mmol/L (ref 0.5–1.9)

## 2020-02-15 LAB — CBG MONITORING, ED: Glucose-Capillary: 108 mg/dL — ABNORMAL HIGH (ref 70–99)

## 2020-02-15 MED ORDER — LEVETIRACETAM 500 MG PO TABS: 500.0000 mg | ORAL_TABLET | Freq: Two times a day (BID) | ORAL | 0 refills | Status: DC

## 2020-02-15 MED ORDER — SODIUM CHLORIDE 0.9 % IV BOLUS
1000.0000 mL | Freq: Once | INTRAVENOUS | Status: AC
  Administered 2020-02-15: 1000 mL via INTRAVENOUS

## 2020-02-15 MED ORDER — LEVETIRACETAM IN NACL 1000 MG/100ML IV SOLN
1000.0000 mg | Freq: Once | INTRAVENOUS | Status: AC
  Administered 2020-02-15: 1000 mg via INTRAVENOUS
  Filled 2020-02-15: qty 100

## 2020-02-15 MED ORDER — CHLORDIAZEPOXIDE HCL 25 MG PO CAPS
ORAL_CAPSULE | ORAL | 0 refills | Status: DC
Start: 2020-02-15 — End: 2020-08-07

## 2020-02-15 NOTE — ED Provider Notes (Signed)
[facility]-EMERGENCY DEPT Provider Note   CSN: [id number] Arrival date & time: 02/15/20  1136     History Chief Complaint  Patient presents with  . Seizures    Daniel Terry is a 34 y.o. male positive history of seizures brought in by EMS for evaluation of possible seizures.  Patient states that he woke up this morning and felt kind of funny.  He is has difficulty describing what this feeling was but just felt "kind of off."  He has had this feeling before and felt like he was going to have a seizure.  He states that his phone was not working so he went to go talk to his landlord and states that that he thinks he had a seizure.  Does not recall what happened afterwards.  He does have a history of seizures and is supposed to be on Keppra but states that he never got the prescription filled.  He does report that he is a daily drinker and drinks "multiple beers" a day.  He states his last drink was yesterday.  He does occasionally smoke marijuana.  Denies any other drug use.  He states he has not had any fevers, been sick recently.  He states now he just feels tired.  Denies any chest pain, difficulty breathing, abdominal pain, nausea/vomiting.  The history is provided by the patient.       History reviewed. No pertinent past medical history.  Patient Active Problem List   Diagnosis Date Noted  . Seizure (HCC) 11/21/2019    No past surgical history on file.     No family history on file.  Social History   Tobacco Use  . Smoking status: Former Games developer  . Smokeless tobacco: Never Used  Substance Use Topics  . Alcohol use: No  . Drug use: Not on file    Home Medications Prior to Admission medications   Medication Sig Start Date End Date Taking? Authorizing Provider  chlordiazePOXIDE (LIBRIUM) 25 MG capsule 50mg  PO TID x 1D, then 25-50mg  PO BID X 1D, then 25-50mg  PO QD X 1D 02/15/20   04/16/20 A, PA-C  levETIRAcetam (KEPPRA) 500 MG tablet Take 1  tablet (500 mg total) by mouth 2 (two) times daily. 02/15/20 03/16/20  03/18/20 A, PA-C  lidocaine (XYLOCAINE) 2 % solution Use as directed 15 mLs in the mouth or throat every 3 (three) hours as needed for mouth pain. 11/22/19   01/22/20, MD  naltrexone (DEPADE) 50 MG tablet Take 1 tablet (50 mg total) by mouth daily. 11/22/19   01/22/20, MD    Allergies    Patient has no known allergies.  Review of Systems   Review of Systems  Constitutional: Negative for fever.  Respiratory: Negative for cough and shortness of breath.   Cardiovascular: Negative for chest pain.  Gastrointestinal: Negative for abdominal pain, nausea and vomiting.  Genitourinary: Negative for dysuria and hematuria.  Neurological: Positive for seizures.  All other systems reviewed and are negative.   Physical Exam Updated Vital Signs BP (!) 131/112   Pulse 78   Temp 98.9 F (37.2 C) (Oral)   Resp (!) 26   Ht 5\' 9"  (1.753 m)   Wt 74.8 kg   SpO2 98%   BMI 24.37 kg/m   Physical Exam Vitals and nursing note reviewed.  Constitutional:      Appearance: Normal appearance. He is well-developed.  HENT:     Head: Normocephalic and atraumatic.  Mouth/Throat:      Comments: Small tongue lac that does not go through and through. Eyes:     General: Lids are normal.     Conjunctiva/sclera: Conjunctivae normal.     Pupils: Pupils are equal, round, and reactive to light.     Comments: PERRL. EOMs intact. No nystagmus. No neglect.   Cardiovascular:     Rate and Rhythm: Normal rate and regular rhythm.     Pulses: Normal pulses.     Heart sounds: Normal heart sounds. No murmur heard.  No friction rub. No gallop.   Pulmonary:     Effort: Pulmonary effort is normal.     Breath sounds: Normal breath sounds.     Comments: Lungs clear to auscultation bilaterally.  Symmetric chest rise.  No wheezing, rales, rhonchi. Abdominal:     Palpations: Abdomen is soft. Abdomen is not rigid.     Tenderness: There  is no abdominal tenderness. There is no guarding.     Comments: Abdomen is soft, non-distended, non-tender. No rigidity, No guarding. No peritoneal signs.  Musculoskeletal:        General: Normal range of motion.     Cervical back: Full passive range of motion without pain.  Skin:    General: Skin is warm and dry.     Capillary Refill: Capillary refill takes less than 2 seconds.  Neurological:     Mental Status: He is alert and oriented to person, place, and time.     Comments: Cranial nerves III-XII intact Follows commands, Moves all extremities  5/5 strength to BUE and BLE  Sensation intact throughout all major nerve distributions No slurred speech. No facial droop.   Psychiatric:        Speech: Speech normal.     ED Results / Procedures / Treatments   Labs (all labs ordered are listed, but only abnormal results are displayed) Labs Reviewed  RAPID URINE DRUG SCREEN, HOSP PERFORMED - Abnormal; Notable for the following components:      Result Value   Tetrahydrocannabinol POSITIVE (*)    All other components within normal limits  COMPREHENSIVE METABOLIC PANEL - Abnormal; Notable for the following components:   Glucose, Bld 103 (*)    Calcium 8.8 (*)    All other components within normal limits  CBC WITH DIFFERENTIAL/PLATELET - Abnormal; Notable for the following components:   WBC 14.4 (*)    Neutro Abs 12.4 (*)    All other components within normal limits  CBG MONITORING, ED - Abnormal; Notable for the following components:   Glucose-Capillary 108 (*)    All other components within normal limits  ETHANOL  LACTIC ACID, PLASMA    EKG None  Radiology CT Head Wo Contrast  Result Date: 02/15/2020 CLINICAL DATA:  Seizure EXAM: CT HEAD WITHOUT CONTRAST TECHNIQUE: Contiguous axial images were obtained from the base of the skull through the vertex without intravenous contrast. COMPARISON:  Head CT dated 12/21/2019. FINDINGS: Brain: Ventricles are normal in size and  configuration. There is no mass, hemorrhage, edema or other evidence of acute parenchymal abnormality. No extra-axial hemorrhage. Stable chronic encephalomalacia within the lower RIGHT frontal lobe, compatible with previous ischemic or traumatic insult. Vascular: No hyperdense vessel or unexpected calcification. Skull: Normal. Negative for fracture or focal lesion. Sinuses/Orbits: Fairly extensive paranasal sinus disease, most prominent at the level of the LEFT maxillary sinus, most likely chronic based on thickening of the walls of the sinuses. Visualized periorbital and retro-orbital soft tissues are unremarkable. Other: None. IMPRESSION:  1. No acute findings. No intracranial mass, hemorrhage or edema. 2. Fairly extensive paranasal sinus disease, most prominent at the level of the LEFT maxillary sinus, most likely chronic based on appearance. Electronically Signed   By: Bary RichardStan  Maynard M.D.   On: 02/15/2020 13:55    Procedures Procedures (including critical care time)  Medications Ordered in ED Medications  sodium chloride 0.9 % bolus 1,000 mL (0 mLs Intravenous Stopped 02/15/20 1436)  levETIRAcetam (KEPPRA) IVPB 1000 mg/100 mL premix (0 mg Intravenous Stopped 02/15/20 1338)    ED Course  I have reviewed the triage vital signs and the nursing notes.  Pertinent labs & imaging results that were available during my care of the patient were reviewed by me and considered in my medical decision making (see chart for details).    MDM Rules/Calculators/A&P                          34 year old male who presents for evaluation of seizure.  Reports that he has had seizures previously.  He is supposed to be on Keppra but is not taking it.  He woke up this morning and felt funny like he was going to have a seizure.  He tried to run to his landlord's health and states that in between then, he had seizures.  He thinks he may have had 3 seizures but is unsure.  On initially arrival, he is afebrile,  nontoxic-appearing.  Vital signs are stable.  No neuro deficits noted on exam.  Lactic is 1.4.  CMP shows normal BUN and creatinine.  Ethanol level is normal.  CBC shows slight leukocytosis of 14.4.   CT scan shows no acute findings.  No intracranial mass hemorrhage or edema.  He has extensive paraspinal nasal disease.  Reevaluation.  Patient is at baseline. He is ambulating without any difficulty.  He is answering questions appropriately.  He will be discharged home with girlfriend.  At this time, he does not any tremors and is hemodynamically stable.  His exam is not concerning for withdrawal.  I suspect this is most likely from noncompliance of his Keppra.  I discussed with him regarding taking the Keppra.  Girlfriend states that he was given Keppra and medication to help him stop drinking but they were never able to pick them up.  Will discharge home on Keppra and Librium.  Ambulatory referral to neurology sent.  I discussed with Dr. Pilar PlateBero who agrees with plan. At this time, patient exhibits no emergent life-threatening condition that require further evaluation in ED or admission. Seizure precautions given. Patient had ample opportunity for questions and discussion. All patient's questions were answered with full understanding. Strict return precautions discussed. Patient expresses understanding and agreement to plan.   Portions of this note were generated with Scientist, clinical (histocompatibility and immunogenetics)Dragon dictation software. Dictation errors may occur despite best attempts at proofreading.   Final Clinical Impression(s) / ED Diagnoses Final diagnoses:  Seizure (HCC)    Rx / DC Orders ED Discharge Orders         Ordered    levETIRAcetam (KEPPRA) 500 MG tablet  2 times daily     Discontinue  Reprint     02/15/20 1439    chlordiazePOXIDE (LIBRIUM) 25 MG capsule     Discontinue  Reprint     02/15/20 1439    Ambulatory referral to Neurology     Discontinue  Reprint    Comments: An appointment is requested in approximately: 1 week  02/15/20 1440           Maxwell Caul, PA-C 02/15/20 1454    Sabas Sous, MD 02/15/20 [id number]

## 2020-02-15 NOTE — ED Triage Notes (Signed)
He tells [place] he has had "three seizures today". He also tells [place] "I never got the prescription filled for the Keppra a month ago". He is alert and oriented x 3 with clear speech. He gets up and capably ambulates from the EMS stretcher to our E.D. bed. He c/o some general muscle aches and tells me he "bit my tongue just a little--not as bad as last time."

## 2020-02-15 NOTE — Discharge Instructions (Addendum)
You should not drive, bathe alone, swim alone until you are evaluated by neuro.  It is important that you take the Keppra.  I provided you Librium to help with stopping alcohol.  You should not drink while on Keppra.  I provided you a referral to neurology.  It is important that you follow-up with neuro.  Return to the emergency department for any vomiting, fever, additional seizure activity or any other worsening concerning symptoms.

## 2020-04-13 ENCOUNTER — Other Ambulatory Visit: Payer: Self-pay

## 2020-04-13 ENCOUNTER — Emergency Department (HOSPITAL_COMMUNITY)
Admission: EM | Admit: 2020-04-13 | Discharge: 2020-04-13 | Disposition: A | Discharge status: Home / Self Care | Payer: Self-pay | Attending: Emergency Medicine | Admitting: Emergency Medicine

## 2020-04-13 DIAGNOSIS — R569 Unspecified convulsions: Secondary | ICD-10-CM | POA: Insufficient documentation

## 2020-04-13 DIAGNOSIS — Z5321 Procedure and treatment not carried out due to patient leaving prior to being seen by health care provider: Secondary | ICD-10-CM | POA: Insufficient documentation

## 2020-04-13 LAB — BASIC METABOLIC PANEL
Anion gap: 16 — ABNORMAL HIGH (ref 5–15)
BUN: 15 mg/dL (ref 6–20)
CO2: 19 mmol/L — ABNORMAL LOW (ref 22–32)
Calcium: 9.4 mg/dL (ref 8.9–10.3)
Chloride: 103 mmol/L (ref 98–111)
Creatinine, Ser: 1.42 mg/dL — ABNORMAL HIGH (ref 0.61–1.24)
GFR calc Af Amer: >60 mL/min (ref >60)
GFR calc non Af Amer: >60 mL/min (ref >60)
Glucose, Bld: 122 mg/dL — ABNORMAL HIGH (ref 70–99)
Potassium: 4.6 mmol/L (ref 3.5–5.1)
Sodium: 138 mmol/L (ref 135–145)

## 2020-04-13 LAB — CBC WITH DIFFERENTIAL/PLATELET
Abs Immature Granulocytes: 0.05 10*3/uL (ref 0.00–0.07)
Basophils Absolute: 0.1 10*3/uL (ref 0.0–0.1)
Basophils Relative: 1 %
Eosinophils Absolute: 0 10*3/uL (ref 0.0–0.5)
Eosinophils Relative: 0 %
HCT: 43.3 % (ref 39.0–52.0)
Hemoglobin: 14.3 g/dL (ref 13.0–17.0)
Immature Granulocytes: 0 %
Lymphocytes Relative: 11 %
Lymphs Abs: 1.3 10*3/uL (ref 0.7–4.0)
MCH: 30 pg (ref 26.0–34.0)
MCHC: 33 g/dL (ref 30.0–36.0)
MCV: 91 fL (ref 80.0–100.0)
Monocytes Absolute: 0.6 10*3/uL (ref 0.1–1.0)
Monocytes Relative: 5 %
Neutro Abs: 9.4 10*3/uL — ABNORMAL HIGH (ref 1.7–7.7)
Neutrophils Relative %: 83 %
Platelets: 358 10*3/uL (ref 150–400)
RBC: 4.76 MIL/uL (ref 4.22–5.81)
RDW: 12 % (ref 11.5–15.5)
WBC: 11.4 10*3/uL — ABNORMAL HIGH (ref 4.0–10.5)
nRBC: 0 % (ref 0.0–0.2)

## 2020-04-13 NOTE — ED Notes (Signed)
Pt seen talking with registration to see who to check out with, registration asked for labels and pt seen leaving

## 2020-04-13 NOTE — ED Triage Notes (Signed)
Reported witnessed sz; EMS endorsed patient is non compliant with Keppra and concern for drug use as well.

## 2020-04-13 NOTE — ED Notes (Signed)
Pt name called for vitals, no response 

## 2020-08-07 ENCOUNTER — Encounter (HOSPITAL_COMMUNITY): Payer: Self-pay

## 2020-08-07 ENCOUNTER — Emergency Department (HOSPITAL_COMMUNITY): Payer: Self-pay

## 2020-08-07 ENCOUNTER — Other Ambulatory Visit: Payer: Self-pay

## 2020-08-07 ENCOUNTER — Emergency Department (HOSPITAL_COMMUNITY)
Admission: EM | Admit: 2020-08-07 | Discharge: 2020-08-07 | Disposition: A | Discharge status: Home / Self Care | Payer: Self-pay | Attending: Emergency Medicine | Admitting: Emergency Medicine

## 2020-08-07 DIAGNOSIS — R569 Unspecified convulsions: Secondary | ICD-10-CM | POA: Insufficient documentation

## 2020-08-07 DIAGNOSIS — Z87891 Personal history of nicotine dependence: Secondary | ICD-10-CM | POA: Insufficient documentation

## 2020-08-07 DIAGNOSIS — Z20822 Contact with and (suspected) exposure to covid-19: Secondary | ICD-10-CM | POA: Insufficient documentation

## 2020-08-07 HISTORY — DX: Unspecified convulsions: R56.9

## 2020-08-07 LAB — URINALYSIS, ROUTINE W REFLEX MICROSCOPIC
Bacteria, UA: NONE SEEN
Bilirubin Urine: NEGATIVE
Glucose, UA: NEGATIVE mg/dL
Ketones, ur: 20 mg/dL — AB
Leukocytes,Ua: NEGATIVE
Nitrite: NEGATIVE
Protein, ur: NEGATIVE mg/dL
Specific Gravity, Urine: 1.011 (ref 1.005–1.030)
pH: 5 (ref 5.0–8.0)

## 2020-08-07 LAB — CBC WITH DIFFERENTIAL/PLATELET
Abs Immature Granulocytes: 0.11 10*3/uL — ABNORMAL HIGH (ref 0.00–0.07)
Basophils Absolute: 0 10*3/uL (ref 0.0–0.1)
Basophils Relative: 0 %
Eosinophils Absolute: 0 10*3/uL (ref 0.0–0.5)
Eosinophils Relative: 0 %
HCT: 39.6 % (ref 39.0–52.0)
Hemoglobin: 13.6 g/dL (ref 13.0–17.0)
Immature Granulocytes: 1 %
Lymphocytes Relative: 5 %
Lymphs Abs: 0.9 10*3/uL (ref 0.7–4.0)
MCH: 30.8 pg (ref 26.0–34.0)
MCHC: 34.3 g/dL (ref 30.0–36.0)
MCV: 89.6 fL (ref 80.0–100.0)
Monocytes Absolute: 1.5 10*3/uL — ABNORMAL HIGH (ref 0.1–1.0)
Monocytes Relative: 8 %
Neutro Abs: 16.6 10*3/uL — ABNORMAL HIGH (ref 1.7–7.7)
Neutrophils Relative %: 86 %
Platelets: 296 10*3/uL (ref 150–400)
RBC: 4.42 MIL/uL (ref 4.22–5.81)
RDW: 12.2 % (ref 11.5–15.5)
WBC: 19.2 10*3/uL — ABNORMAL HIGH (ref 4.0–10.5)
nRBC: 0 % (ref 0.0–0.2)

## 2020-08-07 LAB — COMPREHENSIVE METABOLIC PANEL
ALT: 27 U/L (ref 0–44)
AST: 38 U/L (ref 15–41)
Albumin: 4.2 g/dL (ref 3.5–5.0)
Alkaline Phosphatase: 80 U/L (ref 38–126)
Anion gap: 14 (ref 5–15)
BUN: 8 mg/dL (ref 6–20)
CO2: 21 mmol/L — ABNORMAL LOW (ref 22–32)
Calcium: 8.7 mg/dL — ABNORMAL LOW (ref 8.9–10.3)
Chloride: 101 mmol/L (ref 98–111)
Creatinine, Ser: 1.09 mg/dL (ref 0.61–1.24)
GFR, Estimated: >60 mL/min (ref >60)
Glucose, Bld: 108 mg/dL — ABNORMAL HIGH (ref 70–99)
Potassium: 3.1 mmol/L — ABNORMAL LOW (ref 3.5–5.1)
Sodium: 136 mmol/L (ref 135–145)
Total Bilirubin: 1.1 mg/dL (ref 0.3–1.2)
Total Protein: 7.1 g/dL (ref 6.5–8.1)

## 2020-08-07 LAB — RAPID URINE DRUG SCREEN, HOSP PERFORMED
Amphetamines: NOT DETECTED
Barbiturates: NOT DETECTED
Benzodiazepines: NOT DETECTED
Cocaine: NOT DETECTED
Opiates: POSITIVE — AB
Tetrahydrocannabinol: POSITIVE — AB

## 2020-08-07 LAB — ETHANOL: Alcohol, Ethyl (B): <10 mg/dL (ref <10)

## 2020-08-07 MED ORDER — SODIUM CHLORIDE 0.9 % IV BOLUS
1000.0000 mL | Freq: Once | INTRAVENOUS | Status: AC
  Administered 2020-08-07: 1000 mL via INTRAVENOUS

## 2020-08-07 MED ORDER — LEVETIRACETAM 500 MG PO TABS: 500.0000 mg | ORAL_TABLET | Freq: Two times a day (BID) | ORAL | 0 refills | Status: DC

## 2020-08-07 MED ORDER — LEVETIRACETAM IN NACL 1000 MG/100ML IV SOLN
1000.0000 mg | Freq: Once | INTRAVENOUS | Status: AC
  Administered 2020-08-07: 1000 mg via INTRAVENOUS
  Filled 2020-08-07: qty 100

## 2020-08-07 MED ORDER — MORPHINE SULFATE (PF) 4 MG/ML IV SOLN
4.0000 mg | Freq: Once | INTRAVENOUS | Status: AC
  Administered 2020-08-07: 4 mg via INTRAVENOUS
  Filled 2020-08-07: qty 1

## 2020-08-07 NOTE — ED Triage Notes (Signed)
Pt arrived to ED from home via [facility] after having a seizure while on the phone with his girlfriend. His girlfriend called pt's dad and the dad found pt in the kitchen floor. Pt has hx of seizures d/t brain injury. Last seizure was a month ago. Pt is postictal and becomes agitated when messed with.

## 2020-08-07 NOTE — ED Provider Notes (Signed)
[name] EMERGENCY DEPARTMENT Provider Note   CSN: [id number] Arrival date & time: 08/07/20  1559     History Chief Complaint  Patient presents with  . Seizures    Daniel Terry is a 35 y.o. male.  The history is provided by the patient, the EMS personnel and medical records. No language interpreter was used.  Seizures    34 year old male with known history of seizure currently on Keppra brought here via EMS from home for evaluation of a recent seizure activity.  Per EMS note, patient was on the phone with his girlfriend when he had a seizure.  Girlfriend contacted patient's dad and patient that found patient on the kitchen floor.  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  When EMS arrived, patient was postictal.  Patient report he woke up and found himself in the ER.  He does not know what happened.  He is complaining of aches and pain throughout his entire body.  He also complained of headache.  He bit his tongue.  He feels confused.  Report his last seizure was several weeks prior.  He admits that he have not had his Keppra for the past 2 to 3 days and attributed to forgetting to take it.  He denies alcohol use but admits to marijuana use several days prior.  Denies any other drug use.  Patient report he is very thirsty and he would like something to drink.  He denies any cold symptoms.  He has not been vaccinated for COVID-19.  He has been seen in ED several times in the past for seizures activity.  Patient denies any recent sickness  Past Medical History:  Diagnosis Date  . Seizures [facility])     Patient Active Problem List   Diagnosis Date Noted  . Seizure (HCC) 11/21/2019    History reviewed. No pertinent surgical history.     History reviewed. No pertinent family history.  Social History   Tobacco Use  . Smoking status: Former Games developer  . Smokeless tobacco: Never Used   Substance Use Topics  . Alcohol use: No    Home Medications Prior to Admission medications   Medication Sig Start Date End Date Taking? Authorizing Provider  chlordiazePOXIDE (LIBRIUM) 25 MG capsule 50mg  PO TID x 1D, then 25-50mg  PO BID X 1D, then 25-50mg  PO QD X 1D 02/15/20   04/16/20 A, PA-C  levETIRAcetam (KEPPRA) 500 MG tablet Take 1 tablet (500 mg total) by mouth 2 (two) times daily. 02/15/20 03/16/20  03/18/20 A, PA-C  lidocaine (XYLOCAINE) 2 % solution Use as directed 15 mLs in the mouth or throat every 3 (three) hours as needed for mouth pain. 11/22/19   01/22/20, MD  naltrexone (DEPADE) 50 MG tablet Take 1 tablet (50 mg total) by mouth daily. 11/22/19   01/22/20, MD    Allergies    Patient has no known allergies.  Review of Systems   Review of Systems  Neurological: Positive for seizures.  All other systems reviewed and are negative.   Physical Exam Updated Vital Signs BP 128/84 (BP Location: Left Arm)   Pulse 96   Temp 98.8 F (37.1 C) (Oral)   Resp (!) 27   Ht 5\' 8"  (1.727 m)   Wt 68 kg   SpO2 97%   BMI 22.79 kg/m   Physical Exam Vitals and nursing note reviewed.  Constitutional:  General: He is not in acute distress.    Appearance: He is well-developed and well-nourished.  HENT:     Head: Normocephalic and atraumatic.     Comments: Tenderness to right forehead with mild edema noted    Mouth/Throat:     Comments: Tongue: Bruising noted to lateral aspect of tongue bilaterally.  No deep laceration Eyes:     Extraocular Movements: Extraocular movements intact.     Conjunctiva/sclera: Conjunctivae normal.     Pupils: Pupils are equal, round, and reactive to light.  Cardiovascular:     Rate and Rhythm: Normal rate and regular rhythm.     Pulses: Normal pulses.     Heart sounds: Normal heart sounds.  Pulmonary:     Effort: Pulmonary effort is normal.     Breath sounds: Normal breath sounds.  Abdominal:     Palpations: Abdomen is  soft.     Tenderness: There is no abdominal tenderness.  Musculoskeletal:     Cervical back: Normal range of motion and neck supple. No rigidity or tenderness.  Skin:    Findings: No rash.  Neurological:     Mental Status: He is alert. He is confused.     GCS: GCS eye subscore is 4. GCS verbal subscore is 5. GCS motor subscore is 6.     Cranial Nerves: Cranial nerves are intact.     Sensory: Sensation is intact.     Motor: Motor function is intact.     Comments: Alert to self, month and year.  Psychiatric:        Mood and Affect: Mood and affect normal.     ED Results / Procedures / Treatments   Labs (all labs ordered are listed, but only abnormal results are displayed) Labs Reviewed  CBC WITH DIFFERENTIAL/PLATELET - Abnormal; Notable for the following components:      Result Value   WBC 19.2 (*)    Neutro Abs 16.6 (*)    Monocytes Absolute 1.5 (*)    Abs Immature Granulocytes 0.11 (*)    All other components within normal limits  COMPREHENSIVE METABOLIC PANEL - Abnormal; Notable for the following components:   Potassium 3.1 (*)    CO2 21 (*)    Glucose, Bld 108 (*)    Calcium 8.7 (*)    All other components within normal limits  RAPID URINE DRUG SCREEN, HOSP PERFORMED - Abnormal; Notable for the following components:   Opiates POSITIVE (*)    Tetrahydrocannabinol POSITIVE (*)    All other components within normal limits  URINALYSIS, ROUTINE W REFLEX MICROSCOPIC - Abnormal; Notable for the following components:   APPearance TURBID (*)    Hgb urine dipstick SMALL (*)    Ketones, ur 20 (*)    All other components within normal limits  SARS CORONAVIRUS 2 (TAT 6-24 HRS)  ETHANOL    EKG None  Radiology CT Head Wo Contrast  Result Date: 08/07/2020 CLINICAL DATA:  35 year old male with history of facial trauma from an unwitnessed seizure. EXAM: CT HEAD WITHOUT CONTRAST TECHNIQUE: Contiguous axial images were obtained from the base of the skull through the vertex without  intravenous contrast. COMPARISON:  Head CT 02/15/2020. FINDINGS: Brain: Focal areas of low attenuation in the inferior aspect of the right frontal lobe, similar to prior examinations. No evidence of acute infarction, hemorrhage, hydrocephalus, extra-axial collection or mass lesion/mass effect. Vascular: No hyperdense vessel or unexpected calcification. Skull: Normal. Negative for fracture or focal lesion. Sinuses/Orbits: Multifocal mucosal thickening throughout the visualized paranasal  sinuses, with small air-fluid level in the sphenoid sinus. Other: None. IMPRESSION: 1. No acute intracranial abnormalities. 2. 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 Electronically Signed   By: Trudie Reed M.D.   On: 08/07/2020 18:15    Procedures Procedures (including critical care time)  Medications Ordered in ED Medications  sodium chloride 0.9 % bolus 1,000 mL (0 mLs Intravenous Stopped 08/07/20 2015)  levETIRAcetam (KEPPRA) IVPB 1000 mg/100 mL premix (0 mg Intravenous Stopped 08/07/20 1950)  morphine 4 MG/ML injection 4 mg (4 mg Intravenous Given 08/07/20 2105)    ED Course  I have reviewed the triage vital signs and the nursing notes.  Pertinent labs & imaging results that were available during my care of the patient were reviewed by me and considered in my medical decision making (see chart for details).    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  He is currently on Keppra.  He has been noncompliant with his medication.  He had a seizure episode today.  He is a bit more oriented than earlier but still postictal.  We will give Keppra load, check labs, obtain head CT scan  6:35 PM Head CT scan unremarkable, patient currently receiving Keppra loading dose as well as IV fluid.  11:31 PM UDS positive for opiate and THC.  Urine without signs of urinary tract infection, chest x-ray unremarkable, COVID-19 test is currently pending.  White count is elevated at 19.2, this could be secondary to seizure activities and less likely to be infectious etiology.  Patient is afebrile.At this time he is back to his normal baseline.  Encourage patient to follow-up with neurology for further care.  Recommend patient to resume taking his Keppra as previously prescribed.  Patient should follow-up with COVID test that has been obtained today through MyChart.  Return precaution given  Daniel Terry was evaluated in Emergency Department on 08/07/2020 for the symptoms described in the history of present illness. He was evaluated in the context of the global COVID-19 pandemic, which necessitated consideration that the patient might be at risk for infection with the SARS-CoV-2 virus that causes COVID-19. Institutional protocols and algorithms that pertain to the evaluation of patients at risk for COVID-19 are in a state of rapid change based on information released by regulatory bodies including the CDC and federal and state organizations. These policies and algorithms were followed during the patient's care in the ED.    08/09/2020, PA-C 08/07/20 2332    08/09/20, MD 08/08/20 [phone]

## 2020-08-07 NOTE — ED Notes (Signed)
NT came into pts room. Pt was found walking around room in a panic. NT directed pt back to bed. Pt threw up all over floor and is complaining of his head hurting. RN notified.

## 2020-08-07 NOTE — Discharge Instructions (Signed)
You having evaluated for your seizure activities.  This is likely due to not taking your Keppra adequately.  Please resume taking the medication to prevent future seizures.  Call and follow-up closely with your neurologist for further care.  A COVID test have been obtained today, please check result through MyChart in the next 24 hours.  If you test positive, follow instruction below.  Return if you have any concern  Recommendations for at home COVID-19 symptoms management:  Please continue isolation at home. Call [phone] to see whether you might be eligible for therapeutic antibody infusions (leave your name and they will call you back).  If have acute worsening of symptoms please go to ER/urgent care for further evaluation. Check pulse oximetry and if below 90-92% please go to ER. The following supplements MAY help:  Vitamin C 500mg  twice a day and Quercetin 250-500 mg twice a day Vitamin D3 2000 - 4000 u/day B Complex vitamins Zinc 75-100 mg/day Melatonin 6-10 mg at night (the optimal dose is unknown) Aspirin 81mg /day (if no history of bleeding issues)

## 2020-08-08 LAB — SARS CORONAVIRUS 2 (TAT 6-24 HRS): SARS Coronavirus 2: NEGATIVE

## 2020-09-23 ENCOUNTER — Emergency Department (HOSPITAL_COMMUNITY)
Admission: EM | Admit: 2020-09-23 | Discharge: 2020-09-24 | Disposition: A | Discharge status: Home / Self Care | Payer: Self-pay | Attending: Emergency Medicine | Admitting: Emergency Medicine

## 2020-09-23 ENCOUNTER — Other Ambulatory Visit: Payer: Self-pay

## 2020-09-23 ENCOUNTER — Emergency Department (HOSPITAL_COMMUNITY): Payer: Self-pay

## 2020-09-23 DIAGNOSIS — R079 Chest pain, unspecified: Secondary | ICD-10-CM | POA: Insufficient documentation

## 2020-09-23 DIAGNOSIS — S0083XA Contusion of other part of head, initial encounter: Secondary | ICD-10-CM | POA: Insufficient documentation

## 2020-09-23 DIAGNOSIS — Z8669 Personal history of other diseases of the nervous system and sense organs: Secondary | ICD-10-CM | POA: Insufficient documentation

## 2020-09-23 DIAGNOSIS — Z87891 Personal history of nicotine dependence: Secondary | ICD-10-CM | POA: Insufficient documentation

## 2020-09-23 DIAGNOSIS — R569 Unspecified convulsions: Secondary | ICD-10-CM | POA: Insufficient documentation

## 2020-09-23 DIAGNOSIS — Z23 Encounter for immunization: Secondary | ICD-10-CM | POA: Insufficient documentation

## 2020-09-23 DIAGNOSIS — R519 Headache, unspecified: Secondary | ICD-10-CM | POA: Insufficient documentation

## 2020-09-23 DIAGNOSIS — X58XXXA Exposure to other specified factors, initial encounter: Secondary | ICD-10-CM | POA: Insufficient documentation

## 2020-09-23 LAB — BASIC METABOLIC PANEL
Anion gap: 11 (ref 5–15)
BUN: 10 mg/dL (ref 6–20)
CO2: 22 mmol/L (ref 22–32)
Calcium: 9.1 mg/dL (ref 8.9–10.3)
Chloride: 99 mmol/L (ref 98–111)
Creatinine, Ser: 1.01 mg/dL (ref 0.61–1.24)
GFR, Estimated: >60 mL/min (ref >60)
Glucose, Bld: 186 mg/dL — ABNORMAL HIGH (ref 70–99)
Potassium: 4 mmol/L (ref 3.5–5.1)
Sodium: 132 mmol/L — ABNORMAL LOW (ref 135–145)

## 2020-09-23 LAB — CBC
HCT: 40.8 % (ref 39.0–52.0)
Hemoglobin: 13.9 g/dL (ref 13.0–17.0)
MCH: 31.3 pg (ref 26.0–34.0)
MCHC: 34.1 g/dL (ref 30.0–36.0)
MCV: 91.9 fL (ref 80.0–100.0)
Platelets: 328 10*3/uL (ref 150–400)
RBC: 4.44 MIL/uL (ref 4.22–5.81)
RDW: 11.8 % (ref 11.5–15.5)
WBC: 15.4 10*3/uL — ABNORMAL HIGH (ref 4.0–10.5)
nRBC: 0 % (ref 0.0–0.2)

## 2020-09-23 LAB — TROPONIN I (HIGH SENSITIVITY)
Troponin I (High Sensitivity): 5 ng/L (ref <18)
Troponin I (High Sensitivity): 7 ng/L (ref <18)

## 2020-09-23 NOTE — ED Triage Notes (Signed)
C/o headache 10/10 and dizziness today. Pt reports symptoms started after feeling faint and felt like he was going to have a seizure. Last dose of seizure med was last night. [id number]. Pt also reports feeling anxious and chest pain.

## 2020-09-24 ENCOUNTER — Emergency Department (HOSPITAL_COMMUNITY): Payer: Self-pay

## 2020-09-24 LAB — RAPID URINE DRUG SCREEN, HOSP PERFORMED
Amphetamines: NOT DETECTED
Barbiturates: NOT DETECTED
Benzodiazepines: NOT DETECTED
Cocaine: NOT DETECTED
Opiates: NOT DETECTED
Tetrahydrocannabinol: POSITIVE — AB

## 2020-09-24 LAB — ETHANOL: Alcohol, Ethyl (B): <10 mg/dL (ref <10)

## 2020-09-24 MED ORDER — TETANUS-DIPHTH-ACELL PERTUSSIS 5-2.5-18.5 LF-MCG/0.5 IM SUSY
0.5000 mL | PREFILLED_SYRINGE | Freq: Once | INTRAMUSCULAR | Status: AC
  Administered 2020-09-24: 0.5 mL via INTRAMUSCULAR
  Filled 2020-09-24: qty 0.5

## 2020-09-24 MED ORDER — LEVETIRACETAM IN NACL 1000 MG/100ML IV SOLN
1000.0000 mg | Freq: Once | INTRAVENOUS | Status: AC
  Administered 2020-09-24: 1000 mg via INTRAVENOUS
  Filled 2020-09-24: qty 100

## 2020-09-24 NOTE — ED Provider Notes (Signed)
[name] EMERGENCY DEPARTMENT Provider Note   CSN: [id number] Arrival date & time: 09/23/20  1903     History Chief Complaint  Patient presents with  . Headache  . Chest Pain    Daniel Terry is a 35 y.o. male.  The history is provided by the patient and medical records.  Headache Chest Pain   1:58 AM Patient rushed back from lobby after witnessed seizure.  He is currently post-ictal.  He has been waiting for approx 7 hours.  Staff reports he was initially here for feeling "odd" and like he might have a seizure but also with chest pain.  No seizure meds since last night. Patient was actually about to be roomed in treatment area-- he was found wandering around outside, then trying to get in multiple doors in the triage area, and then had witnessed seizure.  He did strike his head on a door  Seizure short lived and self terminated.  Patient here alone, no visitors with him.  Unsure of drug use.  Does have known hx of seizures.  Past Medical History:  Diagnosis Date  . Seizures [facility])     Patient Active Problem List   Diagnosis Date Noted  . Seizure (HCC) 11/21/2019    No past surgical history on file.     No family history on file.  Social History   Tobacco Use  . Smoking status: Former Games developer  . Smokeless tobacco: Never Used  Substance Use Topics  . Alcohol use: No    Home Medications Prior to Admission medications   Medication Sig Start Date End Date Taking? Authorizing Provider  levETIRAcetam (KEPPRA) 500 MG tablet Take 1 tablet (500 mg total) by mouth 2 (two) times daily. 08/07/20 09/06/20  Fayrene Helper, PA-C    Allergies    Patient has no known allergies.  Review of Systems   Review of Systems  Unable to perform ROS: Other  post-ictal  Physical Exam Updated Vital Signs BP (!) 152/65 (BP Location: Left Arm)   Pulse (!) 106   Temp 98.9 F (37.2 C) (Oral)   Resp (!) 21   Ht 5\' 8"  (1.727 m)   Wt 68 kg   SpO2 99%   BMI 22.79  kg/m   Physical Exam Vitals and nursing note reviewed.  Constitutional:      Appearance: He is well-developed.  HENT:     Head: Normocephalic and atraumatic.     Comments: Contusion to middle of forehead, abrasion present Eyes:     Conjunctiva/sclera: Conjunctivae normal.     Pupils: Pupils are equal, round, and reactive to light.  Cardiovascular:     Rate and Rhythm: Normal rate and regular rhythm.     Heart sounds: Normal heart sounds.  Pulmonary:     Effort: Pulmonary effort is normal.     Breath sounds: Normal breath sounds.  Abdominal:     General: Bowel sounds are normal.     Palpations: Abdomen is soft.  Musculoskeletal:        General: Normal range of motion.     Cervical back: Normal range of motion.  Skin:    General: Skin is warm and dry.  Neurological:     Comments: Somnolent, post-ictal, spontaneously moving arms and legs     ED Results / Procedures / Treatments   Labs (all labs ordered are listed, but only abnormal results are displayed) Labs Reviewed  BASIC METABOLIC PANEL - Abnormal; Notable for the following components:  Result Value   Sodium 132 (*)    Glucose, Bld 186 (*)    All other components within normal limits  CBC - Abnormal; Notable for the following components:   WBC 15.4 (*)    All other components within normal limits  ETHANOL  RAPID URINE DRUG SCREEN, HOSP PERFORMED  TROPONIN I (HIGH SENSITIVITY)  TROPONIN I (HIGH SENSITIVITY)    EKG None  Radiology DG Chest 2 View  Result Date: 09/23/2020 CLINICAL DATA:  Chest pain, shortness of breath EXAM: CHEST - 2 VIEW COMPARISON:  Radiograph 08/07/2020, CT 03/25/2010 FINDINGS: Mild hyperinflation, similar to prior. No consolidation, features of edema, pneumothorax, or effusion. The cardiomediastinal contours are unremarkable. No acute osseous or soft tissue abnormality. IMPRESSION: No acute cardiopulmonary abnormality. Electronically Signed   By: Kreg Shropshire M.D.   On: 09/23/2020 20:12     Procedures Procedures   Medications Ordered in ED Medications  levETIRAcetam (KEPPRA) IVPB 1000 mg/100 mL premix (0 mg Intravenous Stopped 09/24/20 0242)  Tdap (BOOSTRIX) injection 0.5 mL (0.5 mLs Intramuscular Given 09/24/20 0240)    ED Course  I have reviewed the triage vital signs and the nursing notes.  Pertinent labs & imaging results that were available during my care of the patient were reviewed by me and considered in my medical decision making (see chart for details).    MDM Rules/Calculators/A&P  0158 Patient urgently brought back to treatment room after witnessed seizure in lobby in which he struck his head on door.  He had been waiting for nearly 7 hours, initially complaining of feeling "odd" like he might have seizure and chest pain/anxiety.  Currently, he is post-ictal. Does have contusion and abrasion to mid-forehead.  VSS.  Appears he is supposed to taking keppra, unsure of compliance. Given IV keppra load.  Will update tetanus and obtain CT head along with tox labs.  Will monitor.  4:05 AM Patient now awake/alert.  He is ambulatory to bathroom.  Requesting to eat/drink.  Feel this is reasonable.  Ethanol negative.  Awaiting UDS.  5:15 AM Patient remains awake/alert.  Tolerated several cups of water.  States he feels fine, just his head hurts.  Head CT is negative.  No visible laceration, just abrasion present with contusion of the forehead.  He reports he has been complaint with his keppra.  States he just felt "off" today.  He was made aware of breakthrough seizure here today.  Will need to continue his keppra, states he has plenty at home.  He is placed on driving precautions until cleared by neurology.  Return here for new concerns.  Final Clinical Impression(s) / ED Diagnoses Final diagnoses:  Seizure [facility])    Rx / DC Orders ED Discharge Orders    None       Garlon Hatchet, PA-C 09/24/20 0524    Marily Memos, MD 09/24/20 [phone]

## 2020-09-24 NOTE — ED Notes (Signed)
Patient started acting erratic, walked outside, came back inside and then had a seizure.  Patient fell to floor and hit head.  Patient with lac to mid forehead.

## 2020-09-24 NOTE — Discharge Instructions (Signed)
Continue your keppra at home. Recommend not to drive until cleared by your neurologist to do so. Call your neurologist this morning to arrange some follow-up. Return here for new concerns.

## 2020-11-15 ENCOUNTER — Inpatient Hospital Stay (HOSPITAL_COMMUNITY)
Admission: EM | Admit: 2020-11-15 | Discharge: 2020-11-16 | DRG: 101 | Disposition: A | Discharge status: Home / Self Care | Payer: No Typology Code available for payment source | Attending: Internal Medicine | Admitting: Internal Medicine

## 2020-11-15 ENCOUNTER — Emergency Department (HOSPITAL_COMMUNITY): Payer: No Typology Code available for payment source

## 2020-11-15 DIAGNOSIS — Z9114 Patient's other noncompliance with medication regimen: Secondary | ICD-10-CM

## 2020-11-15 DIAGNOSIS — R821 Myoglobinuria: Secondary | ICD-10-CM | POA: Diagnosis present

## 2020-11-15 DIAGNOSIS — E876 Hypokalemia: Secondary | ICD-10-CM | POA: Diagnosis present

## 2020-11-15 DIAGNOSIS — Y92410 Unspecified street and highway as the place of occurrence of the external cause: Secondary | ICD-10-CM | POA: Diagnosis not present

## 2020-11-15 DIAGNOSIS — I4510 Unspecified right bundle-branch block: Secondary | ICD-10-CM | POA: Diagnosis present

## 2020-11-15 DIAGNOSIS — G40909 Epilepsy, unspecified, not intractable, without status epilepticus: Secondary | ICD-10-CM

## 2020-11-15 DIAGNOSIS — N179 Acute kidney failure, unspecified: Secondary | ICD-10-CM | POA: Diagnosis present

## 2020-11-15 DIAGNOSIS — Z87820 Personal history of traumatic brain injury: Secondary | ICD-10-CM

## 2020-11-15 DIAGNOSIS — G40901 Epilepsy, unspecified, not intractable, with status epilepticus: Principal | ICD-10-CM | POA: Diagnosis present

## 2020-11-15 DIAGNOSIS — M6282 Rhabdomyolysis: Secondary | ICD-10-CM | POA: Diagnosis present

## 2020-11-15 DIAGNOSIS — Z20822 Contact with and (suspected) exposure to covid-19: Secondary | ICD-10-CM | POA: Diagnosis present

## 2020-11-15 DIAGNOSIS — R569 Unspecified convulsions: Secondary | ICD-10-CM | POA: Diagnosis present

## 2020-11-15 DIAGNOSIS — G9389 Other specified disorders of brain: Secondary | ICD-10-CM | POA: Diagnosis present

## 2020-11-15 DIAGNOSIS — Z79899 Other long term (current) drug therapy: Secondary | ICD-10-CM | POA: Diagnosis not present

## 2020-11-15 LAB — COMPREHENSIVE METABOLIC PANEL
ALT: 24 U/L (ref 0–44)
AST: 24 U/L (ref 15–41)
Albumin: 4.2 g/dL (ref 3.5–5.0)
Alkaline Phosphatase: 83 U/L (ref 38–126)
Anion gap: 10 (ref 5–15)
BUN: 10 mg/dL (ref 6–20)
CO2: 22 mmol/L (ref 22–32)
Calcium: 8.8 mg/dL — ABNORMAL LOW (ref 8.9–10.3)
Chloride: 103 mmol/L (ref 98–111)
Creatinine, Ser: 1.27 mg/dL — ABNORMAL HIGH (ref 0.61–1.24)
GFR, Estimated: >60 mL/min (ref >60)
Glucose, Bld: 138 mg/dL — ABNORMAL HIGH (ref 70–99)
Potassium: 2.9 mmol/L — ABNORMAL LOW (ref 3.5–5.1)
Sodium: 135 mmol/L (ref 135–145)
Total Bilirubin: 0.8 mg/dL (ref 0.3–1.2)
Total Protein: 7.3 g/dL (ref 6.5–8.1)

## 2020-11-15 LAB — CBC
HCT: 50.3 % (ref 39.0–52.0)
Hemoglobin: 15.4 g/dL (ref 13.0–17.0)
MCH: 30.7 pg (ref 26.0–34.0)
MCHC: 30.6 g/dL (ref 30.0–36.0)
MCV: 100.2 fL — ABNORMAL HIGH (ref 80.0–100.0)
Platelets: 436 10*3/uL — ABNORMAL HIGH (ref 150–400)
RBC: 5.02 MIL/uL (ref 4.22–5.81)
RDW: 12 % (ref 11.5–15.5)
WBC: 18.7 10*3/uL — ABNORMAL HIGH (ref 4.0–10.5)
nRBC: 0 % (ref 0.0–0.2)

## 2020-11-15 LAB — RAPID URINE DRUG SCREEN, HOSP PERFORMED
Amphetamines: NOT DETECTED
Barbiturates: NOT DETECTED
Benzodiazepines: POSITIVE — AB
Cocaine: POSITIVE — AB
Opiates: NOT DETECTED
Tetrahydrocannabinol: POSITIVE — AB

## 2020-11-15 LAB — I-STAT CHEM 8, ED
BUN: 13 mg/dL (ref 6–20)
Calcium, Ion: 1.09 mmol/L — ABNORMAL LOW (ref 1.15–1.40)
Chloride: 105 mmol/L (ref 98–111)
Creatinine, Ser: 1.3 mg/dL — ABNORMAL HIGH (ref 0.61–1.24)
Glucose, Bld: 197 mg/dL — ABNORMAL HIGH (ref 70–99)
HCT: 50 % (ref 39.0–52.0)
Hemoglobin: 17 g/dL (ref 13.0–17.0)
Potassium: 4.3 mmol/L (ref 3.5–5.1)
Sodium: 138 mmol/L (ref 135–145)
TCO2: 11 mmol/L — ABNORMAL LOW (ref 22–32)

## 2020-11-15 LAB — URINALYSIS, ROUTINE W REFLEX MICROSCOPIC
Bacteria, UA: NONE SEEN
Bilirubin Urine: NEGATIVE
Glucose, UA: 50 mg/dL — AB
Ketones, ur: NEGATIVE mg/dL
Leukocytes,Ua: NEGATIVE
Nitrite: NEGATIVE
Protein, ur: 30 mg/dL — AB
Specific Gravity, Urine: 1.013 (ref 1.005–1.030)
pH: 5 (ref 5.0–8.0)

## 2020-11-15 LAB — ETHANOL: Alcohol, Ethyl (B): <10 mg/dL (ref <10)

## 2020-11-15 LAB — LACTIC ACID, PLASMA
Lactic Acid, Venous: >11 mmol/L (ref 0.5–1.9)
Lactic Acid, Venous: 1.5 mmol/L (ref 0.5–1.9)

## 2020-11-15 LAB — RESP PANEL BY RT-PCR (FLU A&B, COVID) ARPGX2
Influenza A by PCR: NEGATIVE
Influenza B by PCR: NEGATIVE
SARS Coronavirus 2 by RT PCR: NEGATIVE

## 2020-11-15 LAB — SAMPLE TO BLOOD BANK

## 2020-11-15 LAB — PHOSPHORUS: Phosphorus: 1.7 mg/dL — ABNORMAL LOW (ref 2.5–4.6)

## 2020-11-15 LAB — MAGNESIUM: Magnesium: 2.3 mg/dL (ref 1.7–2.4)

## 2020-11-15 LAB — PROTIME-INR
INR: 1 (ref 0.8–1.2)
Prothrombin Time: 12.9 seconds (ref 11.4–15.2)

## 2020-11-15 LAB — CK: Total CK: 172 U/L (ref 49–397)

## 2020-11-15 LAB — CBG MONITORING, ED: Glucose-Capillary: 138 mg/dL — ABNORMAL HIGH (ref 70–99)

## 2020-11-15 MED ORDER — SODIUM CHLORIDE 0.9 % IV SOLN: 75.0000 mL/h | INTRAVENOUS | Status: DC

## 2020-11-15 MED ORDER — ENOXAPARIN SODIUM 40 MG/0.4ML IJ SOSY
40.0000 mg | PREFILLED_SYRINGE | INTRAMUSCULAR | Status: DC
  Administered 2020-11-16: 40 mg via SUBCUTANEOUS
  Filled 2020-11-15: qty 0.4

## 2020-11-15 MED ORDER — LACTATED RINGERS IV SOLN
INTRAVENOUS | Status: DC
  Filled 2020-11-15 (×2): qty 1000

## 2020-11-15 MED ORDER — LEVETIRACETAM IN NACL 1000 MG/100ML IV SOLN
1000.0000 mg | Freq: Once | INTRAVENOUS | Status: AC
  Administered 2020-11-15: 1000 mg via INTRAVENOUS
  Filled 2020-11-15: qty 100

## 2020-11-15 MED ORDER — LORAZEPAM 2 MG/ML IJ SOLN
1.0000 mg | INTRAMUSCULAR | Status: DC | PRN
  Administered 2020-11-15: 3 mg via INTRAVENOUS
  Administered 2020-11-16: 2 mg via INTRAVENOUS
  Filled 2020-11-15: qty 2
  Filled 2020-11-15: qty 1

## 2020-11-15 MED ORDER — LEVETIRACETAM IN NACL 1000 MG/100ML IV SOLN
1000.0000 mg | Freq: Two times a day (BID) | INTRAVENOUS | Status: DC
  Administered 2020-11-16: 1000 mg via INTRAVENOUS
  Filled 2020-11-15: qty 100

## 2020-11-15 MED ORDER — SODIUM CHLORIDE 0.9 % IV BOLUS
125.0000 mL | Freq: Once | INTRAVENOUS | Status: AC
  Administered 2020-11-15: 125 mL via INTRAVENOUS

## 2020-11-15 MED ORDER — LORAZEPAM 1 MG PO TABS
1.0000 mg | ORAL_TABLET | ORAL | Status: DC | PRN
Start: 2020-11-15 — End: 2020-11-16

## 2020-11-15 MED ORDER — THIAMINE HCL 100 MG/ML IJ SOLN
100.0000 mg | Freq: Every day | INTRAMUSCULAR | Status: DC
  Administered 2020-11-15: 100 mg via INTRAVENOUS
  Filled 2020-11-15: qty 2

## 2020-11-15 MED ORDER — LORAZEPAM 2 MG/ML IJ SOLN
INTRAMUSCULAR | Status: AC
  Administered 2020-11-15: 2 mg
  Filled 2020-11-15: qty 1

## 2020-11-15 MED ORDER — FOLIC ACID 1 MG PO TABS
1.0000 mg | ORAL_TABLET | Freq: Every day | ORAL | Status: DC
  Administered 2020-11-15 – 2020-11-16 (×2): 1 mg via ORAL
  Filled 2020-11-15 (×2): qty 1

## 2020-11-15 MED ORDER — THIAMINE HCL 100 MG PO TABS
100.0000 mg | ORAL_TABLET | Freq: Every day | ORAL | Status: DC
  Administered 2020-11-16: 100 mg via ORAL
  Filled 2020-11-15: qty 1

## 2020-11-15 MED ORDER — POTASSIUM CHLORIDE 10 MEQ/100ML IV SOLN: 10.0000 meq | INTRAVENOUS | Status: DC

## 2020-11-15 MED ORDER — ONDANSETRON HCL 4 MG/2ML IJ SOLN
4.0000 mg | Freq: Four times a day (QID) | INTRAMUSCULAR | Status: DC | PRN
  Administered 2020-11-15: 4 mg via INTRAVENOUS
  Filled 2020-11-15: qty 2

## 2020-11-15 MED ORDER — POTASSIUM CHLORIDE 10 MEQ/100ML IV SOLN
10.0000 meq | INTRAVENOUS | Status: AC
  Administered 2020-11-15 – 2020-11-16 (×4): 10 meq via INTRAVENOUS
  Filled 2020-11-15 (×4): qty 100

## 2020-11-15 MED ORDER — LACTATED RINGERS IV BOLUS
1000.0000 mL | Freq: Once | INTRAVENOUS | Status: AC
  Administered 2020-11-15: 1000 mL via INTRAVENOUS

## 2020-11-15 MED ORDER — ADULT MULTIVITAMIN W/MINERALS CH
1.0000 | ORAL_TABLET | Freq: Every day | ORAL | Status: DC
  Administered 2020-11-15 – 2020-11-16 (×2): 1 via ORAL
  Filled 2020-11-15 (×2): qty 1
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
## 2020-11-15 NOTE — ED Notes (Signed)
Patient found sitting on side of bed again. Confused. Not speaking but following directions. Returned to bed. ED MD to be made aware.
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
## 2020-11-15 NOTE — ED Notes (Signed)
Neurologist at bedside at this time, patient still not responding to verbal commands, gaze fixed ahead. Airway maintained, RN at bedside.

## 2020-11-15 NOTE — ED Notes (Signed)
Patient seen actively seizing by ED MD. Verbal orders for 2mg  ativan IV push given. Patient on monitor and placed on 2L [place]. Seizures appears to be absent gaze and facial twitching. Patient is not at this time responding to verbal commands.

## 2020-11-15 NOTE — ED Notes (Signed)
Chanetta Marshall (father) [phone]

## 2020-11-15 NOTE — ED Notes (Signed)
Patient to CT with Trauma RN at this time

## 2020-11-15 NOTE — Progress Notes (Signed)
Per Er nurse , patient will be going to 3W shortly.  Will attempt EEG on 3W.

## 2020-11-15 NOTE — ED Notes (Signed)
Per lab tech at this time LA is greater than 11. ED MD made aware
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
## 2020-11-15 NOTE — ED Notes (Signed)
Attempted report at this time.
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
## 2020-11-15 NOTE — ED Notes (Signed)
Patient given water at this time.  

## 2020-11-15 NOTE — ED Notes (Signed)
Family at bedside at this time

## 2020-11-15 NOTE — ED Notes (Signed)
Patient seen out of bed at this time. requesting to use restroom. urinal given, patient placed back into bed. Patient also requesting water. ED MD made aware.

## 2020-11-16 ENCOUNTER — Telehealth: Payer: Self-pay

## 2020-11-16 ENCOUNTER — Inpatient Hospital Stay (HOSPITAL_COMMUNITY): Payer: No Typology Code available for payment source

## 2020-11-16 ENCOUNTER — Other Ambulatory Visit (HOSPITAL_COMMUNITY): Payer: Self-pay

## 2020-11-16 LAB — BASIC METABOLIC PANEL
Anion gap: 10 (ref 5–15)
BUN: 10 mg/dL (ref 6–20)
CO2: 22 mmol/L (ref 22–32)
Calcium: 8.8 mg/dL — ABNORMAL LOW (ref 8.9–10.3)
Chloride: 104 mmol/L (ref 98–111)
Creatinine, Ser: 1.7 mg/dL — ABNORMAL HIGH (ref 0.61–1.24)
GFR, Estimated: 54 mL/min — ABNORMAL LOW (ref >60)
Glucose, Bld: 118 mg/dL — ABNORMAL HIGH (ref 70–99)
Potassium: 3.8 mmol/L (ref 3.5–5.1)
Sodium: 136 mmol/L (ref 135–145)

## 2020-11-16 LAB — CBC
HCT: 37.7 % — ABNORMAL LOW (ref 39.0–52.0)
Hemoglobin: 13 g/dL (ref 13.0–17.0)
MCH: 31 pg (ref 26.0–34.0)
MCHC: 34.5 g/dL (ref 30.0–36.0)
MCV: 90 fL (ref 80.0–100.0)
Platelets: 249 10*3/uL (ref 150–400)
RBC: 4.19 MIL/uL — ABNORMAL LOW (ref 4.22–5.81)
RDW: 12.1 % (ref 11.5–15.5)
WBC: 16 10*3/uL — ABNORMAL HIGH (ref 4.0–10.5)
nRBC: 0 % (ref 0.0–0.2)

## 2020-11-16 LAB — VITAMIN B12: Vitamin B-12: 375 pg/mL (ref 180–914)

## 2020-11-16 LAB — FOLATE: Folate: 31.7 ng/mL (ref >5.9)

## 2020-11-16 LAB — CK: Total CK: 170 U/L (ref 49–397)

## 2020-11-16 LAB — GLUCOSE, CAPILLARY: Glucose-Capillary: 114 mg/dL — ABNORMAL HIGH (ref 70–99)

## 2020-11-16 LAB — HIV ANTIBODY (ROUTINE TESTING W REFLEX): HIV Screen 4th Generation wRfx: NONREACTIVE

## 2020-11-16 MED ORDER — OXYCODONE HCL 5 MG PO TABS
5.0000 mg | ORAL_TABLET | ORAL | Status: DC | PRN
Start: 2020-11-16 — End: 2020-11-16

## 2020-11-16 MED ORDER — BACLOFEN 5 MG PO TABS
1.0000 | ORAL_TABLET | Freq: Three times a day (TID) | ORAL | 0 refills | Status: DC | PRN
Start: 2020-11-16 — End: 2022-08-13
  Filled 2020-11-16: qty 30, 5d supply, fill #0

## 2020-11-16 MED ORDER — BACLOFEN 10 MG PO TABS
5.0000 mg | ORAL_TABLET | Freq: Three times a day (TID) | ORAL | Status: DC
  Administered 2020-11-16: 5 mg via ORAL
  Filled 2020-11-16: qty 1

## 2020-11-16 MED ORDER — LEVETIRACETAM 500 MG PO TABS
500.0000 mg | ORAL_TABLET | Freq: Two times a day (BID) | ORAL | 0 refills | Status: DC
  Filled 2020-11-16: qty 60, 30d supply, fill #0

## 2020-11-16 MED ORDER — POTASSIUM PHOSPHATES 15 MMOLE/5ML IV SOLN
30.0000 mmol | Freq: Once | INTRAVENOUS | Status: AC
  Administered 2020-11-16: 30 mmol via INTRAVENOUS
  Filled 2020-11-16: qty 10

## 2020-11-16 MED ORDER — ACETAMINOPHEN 325 MG PO TABS: 650.0000 mg | ORAL_TABLET | Freq: Four times a day (QID) | ORAL | Status: DC | PRN

## 2020-11-16 NOTE — Progress Notes (Signed)
Stat EEG normal for sleep. Will do LTM to ensure no more seizures. -- Milon Dikes, MD Neurologist [facility] Pager: [phone]

## 2020-11-16 NOTE — Telephone Encounter (Signed)
Dr Mcarthur Rossetti requested a one week Hospital f/u for pt .. was given 11/24/20@3 :15
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
## 2020-11-16 NOTE — Progress Notes (Signed)
LTM discontinued; patient had already pulled electrodes off; no skin breakdown was seen.
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
## 2020-11-16 NOTE — Plan of Care (Signed)
  Problem: Education: Goal: Expressions of having a comfortable level of knowledge regarding the disease process will increase Outcome: Progressing   Problem: Coping: Goal: Ability to adjust to condition or change in health will improve Outcome: Progressing Goal: Ability to identify appropriate support needs will improve Outcome: Progressing   Problem: Health Behavior/Discharge Planning: Goal: Compliance with prescribed medication regimen will improve Outcome: Progressing   Problem: Medication: Goal: Risk for medication side effects will decrease Outcome: Progressing   Problem: Clinical Measurements: Goal: Complications related to the disease process, condition or treatment will be avoided or minimized Outcome: Progressing Goal: Diagnostic test results will improve Outcome: Progressing   Problem: Safety: Goal: Verbalization of understanding the information provided will improve Outcome: Progressing   Problem: Self-Concept: Goal: Level of anxiety will decrease Outcome: Progressing Goal: Ability to verbalize feelings about condition will improve Outcome: Progressing   Problem: Education: Goal: Knowledge of disease or condition will improve Outcome: Progressing Goal: Understanding of discharge needs will improve Outcome: Progressing   Problem: Health Behavior/Discharge Planning: Goal: Ability to identify changes in lifestyle to reduce recurrence of condition will improve Outcome: Progressing Goal: Identification of resources available to assist in meeting health care needs will improve Outcome: Progressing   Problem: Physical Regulation: Goal: Complications related to the disease process, condition or treatment will be avoided or minimized Outcome: Progressing   Problem: Safety: Goal: Ability to remain free from injury will improve Outcome: Progressing
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
## 2020-11-16 NOTE — Discharge Instructions (Signed)
Mr Daniel Terry,  You were admitted to the hospital following multiple seizures in setting of not taking your Keppra for several days. On discharge, please take your Keppra twice daily as prescribed. You will need to establish care with a primary care doctor to monitor your Keppra levels and ensure that you don't run out of medications. You will also need an MRI Brain as outpatient.  On admission, you were noted to have a kidney injury secondary to your seizures. You received fluids for this during this admission. On discharge, please continue to hydrate adequately. Please schedule a visit with a primary care doctor WITHIN 1-2 WEEKS to follow up on this. Meanwhile, if you start experiencing extreme fatigue or any urinary symptoms, please seek medical attention.   Per [facility] statutes, patients with seizures are not allowed to drive until  they have been seizure-free for six months. We would also recommend the following precautions: - Avoid alcohol. - Use caution when using heavy equipment or power tools.  - Avoid working on ladders or at heights.  - Take showers instead of baths. Ensure the water temperature is not too high on the home water heater. -  Do not go swimming alone.  - When caring for infants or small children, sit down when holding, feeding, or changing them to minimize risk of injury to the child in the event you have a seizure. - Maintain good sleep hygiene.    Thank you!

## 2020-11-16 NOTE — Progress Notes (Signed)
LTM EEG hooked up and running - no initial skin breakdown - push button tested - neuro notified. [facility] monitored, Event button test confirmed by [facility].   

## 2020-11-16 NOTE — Plan of Care (Signed)
  Problem: Education: Goal: Expressions of having a comfortable level of knowledge regarding the disease process will increase Outcome: Adequate for Discharge   Problem: Coping: Goal: Ability to adjust to condition or change in health will improve Outcome: Adequate for Discharge Goal: Ability to identify appropriate support needs will improve Outcome: Adequate for Discharge   Problem: Health Behavior/Discharge Planning: Goal: Compliance with prescribed medication regimen will improve Outcome: Adequate for Discharge   Problem: Medication: Goal: Risk for medication side effects will decrease Outcome: Adequate for Discharge   Problem: Clinical Measurements: Goal: Complications related to the disease process, condition or treatment will be avoided or minimized Outcome: Adequate for Discharge Goal: Diagnostic test results will improve Outcome: Adequate for Discharge   Problem: Safety: Goal: Verbalization of understanding the information provided will improve Outcome: Adequate for Discharge   Problem: Self-Concept: Goal: Level of anxiety will decrease Outcome: Adequate for Discharge Goal: Ability to verbalize feelings about condition will improve Outcome: Adequate for Discharge   Problem: Education: Goal: Knowledge of disease or condition will improve Outcome: Adequate for Discharge Goal: Understanding of discharge needs will improve Outcome: Adequate for Discharge   Problem: Health Behavior/Discharge Planning: Goal: Ability to identify changes in lifestyle to reduce recurrence of condition will improve Outcome: Adequate for Discharge Goal: Identification of resources available to assist in meeting health care needs will improve Outcome: Adequate for Discharge   Problem: Physical Regulation: Goal: Complications related to the disease process, condition or treatment will be avoided or minimized Outcome: Adequate for Discharge   Problem: Safety: Goal: Ability to remain free  from injury will improve Outcome: Adequate for Discharge   Problem: Education: Goal: Expressions of having a comfortable level of knowledge regarding the disease process will increase Outcome: Adequate for Discharge   Problem: Coping: Goal: Ability to adjust to condition or change in health will improve Outcome: Adequate for Discharge Goal: Ability to identify appropriate support needs will improve Outcome: Adequate for Discharge   Problem: Health Behavior/Discharge Planning: Goal: Compliance with prescribed medication regimen will improve Outcome: Adequate for Discharge   Problem: Medication: Goal: Risk for medication side effects will decrease Outcome: Adequate for Discharge   Problem: Clinical Measurements: Goal: Complications related to the disease process, condition or treatment will be avoided or minimized Outcome: Adequate for Discharge Goal: Diagnostic test results will improve Outcome: Adequate for Discharge   Problem: Safety: Goal: Verbalization of understanding the information provided will improve Outcome: Adequate for Discharge   Problem: Self-Concept: Goal: Level of anxiety will decrease Outcome: Adequate for Discharge Goal: Ability to verbalize feelings about condition will improve Outcome: Adequate for Discharge   Problem: Education: Goal: Knowledge of disease or condition will improve Outcome: Adequate for Discharge Goal: Understanding of discharge needs will improve Outcome: Adequate for Discharge   Problem: Health Behavior/Discharge Planning: Goal: Ability to identify changes in lifestyle to reduce recurrence of condition will improve Outcome: Adequate for Discharge Goal: Identification of resources available to assist in meeting health care needs will improve Outcome: Adequate for Discharge   Problem: Physical Regulation: Goal: Complications related to the disease process, condition or treatment will be avoided or minimized Outcome: Adequate for  Discharge   Problem: Safety: Goal: Ability to remain free from injury will improve Outcome: Adequate for Discharge

## 2020-11-16 NOTE — Progress Notes (Signed)
Pt only AOx2 at this time, therefore admin documentation is not complete.

## 2020-11-16 NOTE — TOC Transition Note (Signed)
Transition of Care [facility]) - CM/SW Discharge Note   Patient Details  Name: Daniel Terry MRN: [id number] Date of Birth: [birth year]  [facility] CM/SW Contact:  Geralynn Ochs, LCSW Phone Number: 11/16/2020, 11:38 AM   Clinical Narrative:   CSW notified by MD that patient discharging home, will need PCP established, assistance with medicines, and substance abuse resources. CSW met with patient with significant other at bedside. Patient confirmed he doesn't have any insurance, and is agreeable to set up with PCP. Significant other confirmed transportation is available to appointments. Appointment scheduled with [facility] for December 29, 2020 at 1:50 pm, CSW added to AVS. CSW also discussed with patient the need to avoid substances with his seizures, and offered substance abuse resources. Patient took resources and agreed to get help. Significant other at bedside supportive. No other TOC needs at this time, signing off.    Final next level of care: Home/Self Care Barriers to Discharge: Barriers Resolved   Patient Goals and CMS Choice Patient states their goals for this hospitalization and ongoing recovery are:: to get out of the hospital bed and back home CMS Medicare.gov Compare Post Acute Care list provided to:: Patient Choice offered to / list presented to : Patient  Discharge Placement                Patient to be transferred to facility by: Family car Name of family member notified: Self Patient and family notified of of transfer: 11/16/20  Discharge Plan and Services                                     Social Determinants of Health (SDOH) Interventions     Readmission Risk Interventions No flowsheet data found.
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
## 2020-11-16 NOTE — Progress Notes (Addendum)
Stat  EEG complete - results pending.  

## 2020-11-18 LAB — LEVETIRACETAM LEVEL: Levetiracetam Lvl: <1 ug/mL — ABNORMAL LOW (ref 10.0–40.0)

## 2020-11-19 NOTE — Telephone Encounter (Signed)
Transition Care Management Unsuccessful Follow-up Telephone Call  Date of discharge and from where:  11/16/20 [facility]  Attempts:  1st Attempt  Reason for unsuccessful TCM follow-up call:  No answer/busy

## 2020-11-20 LAB — METHYLMALONIC ACID, SERUM: Methylmalonic Acid, Quantitative: 215 nmol/L (ref 0–378)

## 2020-11-24 ENCOUNTER — Telehealth: Payer: Self-pay | Admitting: *Deleted

## 2020-11-24 ENCOUNTER — Encounter: Payer: Self-pay | Admitting: Student

## 2020-11-24 NOTE — Telephone Encounter (Signed)
Called patient unable to leave message/ voice message is full. Patient did not keep appointment.

## 2020-12-22 ENCOUNTER — Observation Stay (HOSPITAL_COMMUNITY)
Admission: EM | Admit: 2020-12-22 | Discharge: 2020-12-23 | Disposition: A | Discharge status: Home / Self Care | Attending: Internal Medicine | Admitting: Internal Medicine

## 2020-12-22 ENCOUNTER — Emergency Department (HOSPITAL_COMMUNITY)

## 2020-12-22 ENCOUNTER — Encounter (HOSPITAL_COMMUNITY): Payer: Self-pay | Admitting: Family Medicine

## 2020-12-22 DIAGNOSIS — E872 Acidosis: Secondary | ICD-10-CM | POA: Insufficient documentation

## 2020-12-22 DIAGNOSIS — R569 Unspecified convulsions: Secondary | ICD-10-CM

## 2020-12-22 DIAGNOSIS — Z23 Encounter for immunization: Secondary | ICD-10-CM | POA: Insufficient documentation

## 2020-12-22 DIAGNOSIS — F1910 Other psychoactive substance abuse, uncomplicated: Secondary | ICD-10-CM

## 2020-12-22 DIAGNOSIS — W1839XA Other fall on same level, initial encounter: Secondary | ICD-10-CM | POA: Insufficient documentation

## 2020-12-22 DIAGNOSIS — G40909 Epilepsy, unspecified, not intractable, without status epilepticus: Secondary | ICD-10-CM

## 2020-12-22 DIAGNOSIS — M5020 Other cervical disc displacement, unspecified cervical region: Secondary | ICD-10-CM

## 2020-12-22 DIAGNOSIS — Z87891 Personal history of nicotine dependence: Secondary | ICD-10-CM | POA: Diagnosis not present

## 2020-12-22 DIAGNOSIS — G9340 Encephalopathy, unspecified: Secondary | ICD-10-CM | POA: Diagnosis not present

## 2020-12-22 DIAGNOSIS — Z20822 Contact with and (suspected) exposure to covid-19: Secondary | ICD-10-CM | POA: Insufficient documentation

## 2020-12-22 DIAGNOSIS — S01111A Laceration without foreign body of right eyelid and periocular area, initial encounter: Secondary | ICD-10-CM | POA: Diagnosis not present

## 2020-12-22 DIAGNOSIS — M50222 Other cervical disc displacement at C5-C6 level: Secondary | ICD-10-CM | POA: Insufficient documentation

## 2020-12-22 DIAGNOSIS — S0990XA Unspecified injury of head, initial encounter: Secondary | ICD-10-CM | POA: Diagnosis present

## 2020-12-22 DIAGNOSIS — E876 Hypokalemia: Secondary | ICD-10-CM

## 2020-12-22 LAB — COMPREHENSIVE METABOLIC PANEL
ALT: 28 U/L (ref 0–44)
AST: 36 U/L (ref 15–41)
Albumin: 4.5 g/dL (ref 3.5–5.0)
Alkaline Phosphatase: 97 U/L (ref 38–126)
BUN: 12 mg/dL (ref 6–20)
CO2: <7 mmol/L — ABNORMAL LOW (ref 22–32)
Calcium: 9 mg/dL (ref 8.9–10.3)
Chloride: 101 mmol/L (ref 98–111)
Creatinine, Ser: 1.62 mg/dL — ABNORMAL HIGH (ref 0.61–1.24)
GFR, Estimated: 57 mL/min — ABNORMAL LOW (ref >60)
Glucose, Bld: 277 mg/dL — ABNORMAL HIGH (ref 70–99)
Potassium: 3.9 mmol/L (ref 3.5–5.1)
Sodium: 137 mmol/L (ref 135–145)
Total Bilirubin: 0.5 mg/dL (ref 0.3–1.2)
Total Protein: 7.8 g/dL (ref 6.5–8.1)

## 2020-12-22 LAB — I-STAT CHEM 8, ED
BUN: 9 mg/dL (ref 6–20)
Calcium, Ion: 0.85 mmol/L — CL (ref 1.15–1.40)
Chloride: 108 mmol/L (ref 98–111)
Creatinine, Ser: 0.7 mg/dL (ref 0.61–1.24)
Glucose, Bld: 145 mg/dL — ABNORMAL HIGH (ref 70–99)
HCT: 29 % — ABNORMAL LOW (ref 39.0–52.0)
Hemoglobin: 9.9 g/dL — ABNORMAL LOW (ref 13.0–17.0)
Potassium: 3.9 mmol/L (ref 3.5–5.1)
Sodium: 140 mmol/L (ref 135–145)
TCO2: 17 mmol/L — ABNORMAL LOW (ref 22–32)

## 2020-12-22 LAB — CBC
HCT: 46.5 % (ref 39.0–52.0)
Hemoglobin: 14.2 g/dL (ref 13.0–17.0)
MCH: 31.1 pg (ref 26.0–34.0)
MCHC: 30.5 g/dL (ref 30.0–36.0)
MCV: 102 fL — ABNORMAL HIGH (ref 80.0–100.0)
Platelets: 399 10*3/uL (ref 150–400)
RBC: 4.56 MIL/uL (ref 4.22–5.81)
RDW: 11.9 % (ref 11.5–15.5)
WBC: 16.3 10*3/uL — ABNORMAL HIGH (ref 4.0–10.5)
nRBC: 0 % (ref 0.0–0.2)

## 2020-12-22 LAB — RAPID URINE DRUG SCREEN, HOSP PERFORMED
Amphetamines: NOT DETECTED
Barbiturates: NOT DETECTED
Benzodiazepines: POSITIVE — AB
Cocaine: POSITIVE — AB
Opiates: NOT DETECTED
Tetrahydrocannabinol: POSITIVE — AB

## 2020-12-22 LAB — LACTIC ACID, PLASMA: Lactic Acid, Venous: >11 mmol/L (ref 0.5–1.9)

## 2020-12-22 LAB — CBG MONITORING, ED: Glucose-Capillary: 219 mg/dL — ABNORMAL HIGH (ref 70–99)

## 2020-12-22 MED ORDER — TETANUS-DIPHTH-ACELL PERTUSSIS 5-2.5-18.5 LF-MCG/0.5 IM SUSY
0.5000 mL | PREFILLED_SYRINGE | Freq: Once | INTRAMUSCULAR | Status: AC
  Administered 2020-12-22: 0.5 mL via INTRAMUSCULAR
  Filled 2020-12-22: qty 0.5

## 2020-12-22 MED ORDER — LORAZEPAM 2 MG/ML IJ SOLN
2.0000 mg | Freq: Once | INTRAMUSCULAR | Status: AC
  Administered 2020-12-23: 2 mg via INTRAVENOUS
  Filled 2020-12-22: qty 1

## 2020-12-22 MED ORDER — LORAZEPAM 2 MG/ML IJ SOLN
2.0000 mg | Freq: Once | INTRAMUSCULAR | Status: AC
  Administered 2020-12-22: 2 mg via INTRAVENOUS
  Filled 2020-12-22: qty 1

## 2020-12-22 MED ORDER — SODIUM CHLORIDE 0.9 % IV BOLUS
1000.0000 mL | Freq: Once | INTRAVENOUS | Status: AC
  Administered 2020-12-22: 1000 mL via INTRAVENOUS

## 2020-12-22 MED ORDER — ZIPRASIDONE MESYLATE 20 MG IM SOLR
20.0000 mg | Freq: Once | INTRAMUSCULAR | Status: AC
  Administered 2020-12-22: 20 mg via INTRAMUSCULAR
  Filled 2020-12-22: qty 20

## 2020-12-22 MED ORDER — LEVETIRACETAM IN NACL 1000 MG/100ML IV SOLN
1000.0000 mg | INTRAVENOUS | Status: AC
Start: 2020-12-22 — End: 2020-12-22
  Administered 2020-12-22 (×2): 1000 mg via INTRAVENOUS
  Filled 2020-12-22 (×2): qty 100

## 2020-12-22 MED ORDER — STERILE WATER FOR INJECTION IJ SOLN
INTRAMUSCULAR | Status: AC
  Administered 2020-12-22: 1 mL
  Filled 2020-12-22: qty 10

## 2020-12-22 MED ORDER — SODIUM CHLORIDE 0.9 % IV SOLN: 2000.0000 mg | Freq: Once | INTRAVENOUS | Status: DC

## 2020-12-22 MED ORDER — LIDOCAINE HCL (PF) 1 % IJ SOLN
5.0000 mL | Freq: Once | INTRAMUSCULAR | Status: DC
  Filled 2020-12-22: qty 5

## 2020-12-22 NOTE — ED Notes (Signed)
Patient transported to CT 

## 2020-12-22 NOTE — ED Notes (Signed)
Md hong notified of lactic greater than 11
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
## 2020-12-22 NOTE — ED Triage Notes (Signed)
Pt arrived via ems due to having seizures. Pt was found unresponsive in cell. Pt hit head during his seizure and has a laceration on the right side of his foreahead. According to ems pt has a hx of seizures however in prison they were not aware and pt has not been on medication for them. EMS reports pt had 5 seizures with them and they gave him 7.5 of midazolam.

## 2020-12-22 NOTE — ED Provider Notes (Signed)
[facility] EMERGENCY DEPARTMENT Provider Note   CSN: [id number] Arrival date & time: 12/22/20  2027     History Chief Complaint  Patient presents with  . Seizures    Daniel Terry is a 35 y.o. male.  Presents with recurrent seizure episode.  He has a history of seizure disorder, known to be noncompliant.  Has recently been incarcerated but per prison guard had failed to tell anybody he has a seizure disorder and requires Keppra.  He has been there for at least 2 days to the guards knowledge.  Patient had 4 seizures today apparently 2 at the facility where he fell and hit his head, and then another 2 on route to the ER here.  He is given Versed by EMS.  Upon arrival to the ER is tachycardic obtunded and no longer seizing at this time.        Past Medical History:  Diagnosis Date  . Seizures [facility])     Patient Active Problem List   Diagnosis Date Noted  . Seizures (HCC) 11/15/2020  . Seizure (HCC) 11/21/2019    No past surgical history on file.     No family history on file.  Social History   Tobacco Use  . Smoking status: Former Games developer  . Smokeless tobacco: Never Used  Substance Use Topics  . Alcohol use: No    Home Medications Prior to Admission medications   Medication Sig Start Date End Date Taking? Authorizing Provider  Baclofen 5 MG TABS Take 1 tablet (5mg  total) by mouth 3 (three) times daily as needed for muscle spasms. 11/16/20   01/16/21, MD  levETIRAcetam (KEPPRA) 500 MG tablet Take 1 tablet (500 mg total) by mouth 2 (two) times daily. 08/07/20 09/06/20  09/08/20, PA-C  levETIRAcetam (KEPPRA) 500 MG tablet Take 1 tablet (500 mg total) by mouth 2 (two) times daily. 11/16/20 12/16/20  02/15/21, MD    Allergies    Patient has no known allergies.  Review of Systems   Review of Systems  Unable to perform ROS: Acuity of condition    Physical Exam Updated Vital Signs BP 131/64   Pulse 92   Resp 18   SpO2 99%   Physical  Exam Constitutional:      Appearance: He is well-developed. He is ill-appearing.     Comments: Obtunded  HENT:     Head: Normocephalic.     Comments: 2 cm right temporal laceration.    Nose: Nose normal.  Eyes:     Extraocular Movements: Extraocular movements intact.  Cardiovascular:     Rate and Rhythm: Normal rate.  Pulmonary:     Effort: Pulmonary effort is normal.  Skin:    Coloration: Skin is not jaundiced.  Neurological:     Comments: Appears to be moving all extremities.  But appears obtunded and unable to follow commands.     ED Results / Procedures / Treatments   Labs (all labs ordered are listed, but only abnormal results are displayed) Labs Reviewed  CBC - Abnormal; Notable for the following components:      Result Value   WBC 16.3 (*)    MCV 102.0 (*)    All other components within normal limits  COMPREHENSIVE METABOLIC PANEL - Abnormal; Notable for the following components:   CO2 <7 (*)    Glucose, Bld 277 (*)    Creatinine, Ser 1.62 (*)    GFR, Estimated 57 (*)    All other components within  normal limits  LACTIC ACID, PLASMA - Abnormal; Notable for the following components:   Lactic Acid, Venous >11.0 (*)    All other components within normal limits  CBG MONITORING, ED - Abnormal; Notable for the following components:   Glucose-Capillary 219 (*)    All other components within normal limits  I-STAT CHEM 8, ED - Abnormal; Notable for the following components:   Glucose, Bld 145 (*)    Calcium, Ion 0.85 (*)    TCO2 17 (*)    Hemoglobin 9.9 (*)    HCT 29.0 (*)    All other components within normal limits  RESP PANEL BY RT-PCR (FLU A&B, COVID) ARPGX2  RAPID URINE DRUG SCREEN, HOSP PERFORMED    EKG EKG Interpretation  Date/Time:  Wednesday December 22 2020 20:37:10 EDT Ventricular Rate:  132 PR Interval:  115 QRS Duration: 105 QT Interval:  309 QTC Calculation: 458 R Axis:   81 Text Interpretation: Sinus tachycardia LAE, consider biatrial  enlargement RSR' in V1 or V2, right VCD or RVH ST elev, probable normal early repol pattern Confirmed by Norman Clay ([id number]) on 12/22/2020 10:42:07 PM   Radiology No results found.  Procedures .Critical Care Performed by: Cheryll Cockayne, MD Authorized by: Cheryll Cockayne, MD   Critical care provider statement:    Critical care time (minutes):  40   Critical care time was exclusive of:  Separately billable procedures and treating other patients   Critical care was necessary to treat or prevent imminent or life-threatening deterioration of the following conditions:  CNS failure or compromise     Medications Ordered in ED Medications  lidocaine (PF) (XYLOCAINE) 1 % injection 5 mL (has no administration in time range)  LORazepam (ATIVAN) injection 2 mg (has no administration in time range)  LORazepam (ATIVAN) injection 2 mg (2 mg Intravenous Given 12/22/20 2051)  sodium chloride 0.9 % bolus 1,000 mL (0 mLs Intravenous Stopped 12/22/20 2125)  Tdap (BOOSTRIX) injection 0.5 mL (0.5 mLs Intramuscular Given 12/22/20 2104)  levETIRAcetam (KEPPRA) IVPB 1000 mg/100 mL premix (0 mg Intravenous Stopped 12/22/20 2143)  ziprasidone (GEODON) injection 20 mg (20 mg Intramuscular Given 12/22/20 2246)  sterile water (preservative free) injection (1 mL  Given 12/22/20 2249)    ED Course  I have reviewed the triage vital signs and the nursing notes.  Pertinent labs & imaging results that were available during my care of the patient were reviewed by me and considered in my medical decision making (see chart for details).    MDM Rules/Calculators/A&P                          Patient is tachycardic with evidence of head injury and reported multiple seizures.  Labs are sent consistent with evidence of recent seizure.  Patient given IV fluids and loaded with 2 g of Keppra IV.  UDS is sent COVID is sent.  CT imaging was of poor images due to patient lack of cooperation and agitation.  After few hours in the ER  the patient is now awake but agitated combative not cooperative.  He is able to communicate but focuses on asking to be released and not following any commands.  He was given Geodon IM, will pursue additional screening imaging.  Neurology was consulted commending 24-hour observation given multiple back-to-back seizures.  Admitted to the hospitalist team.  Final Clinical Impression(s) / ED Diagnoses Final diagnoses:  Seizure disorder (HCC)  Acute encephalopathy    Rx /  DC Orders ED Discharge Orders    None       Cheryll Cockayne, MD 12/22/20 2312

## 2020-12-23 ENCOUNTER — Observation Stay (HOSPITAL_COMMUNITY)

## 2020-12-23 DIAGNOSIS — G40909 Epilepsy, unspecified, not intractable, without status epilepticus: Secondary | ICD-10-CM

## 2020-12-23 DIAGNOSIS — E876 Hypokalemia: Secondary | ICD-10-CM

## 2020-12-23 DIAGNOSIS — M5020 Other cervical disc displacement, unspecified cervical region: Secondary | ICD-10-CM

## 2020-12-23 LAB — CBC
HCT: 35.6 % — ABNORMAL LOW (ref 39.0–52.0)
Hemoglobin: 12.5 g/dL — ABNORMAL LOW (ref 13.0–17.0)
MCH: 31.3 pg (ref 26.0–34.0)
MCHC: 35.1 g/dL (ref 30.0–36.0)
MCV: 89.2 fL (ref 80.0–100.0)
Platelets: 243 10*3/uL (ref 150–400)
RBC: 3.99 MIL/uL — ABNORMAL LOW (ref 4.22–5.81)
RDW: 11.9 % (ref 11.5–15.5)
WBC: 19.7 10*3/uL — ABNORMAL HIGH (ref 4.0–10.5)
nRBC: 0 % (ref 0.0–0.2)

## 2020-12-23 LAB — BASIC METABOLIC PANEL
Anion gap: 10 (ref 5–15)
BUN: 10 mg/dL (ref 6–20)
CO2: 20 mmol/L — ABNORMAL LOW (ref 22–32)
Calcium: 8.7 mg/dL — ABNORMAL LOW (ref 8.9–10.3)
Chloride: 106 mmol/L (ref 98–111)
Creatinine, Ser: 1.39 mg/dL — ABNORMAL HIGH (ref 0.61–1.24)
GFR, Estimated: >60 mL/min (ref >60)
Glucose, Bld: 100 mg/dL — ABNORMAL HIGH (ref 70–99)
Potassium: 2.9 mmol/L — ABNORMAL LOW (ref 3.5–5.1)
Sodium: 136 mmol/L (ref 135–145)

## 2020-12-23 LAB — LACTIC ACID, PLASMA
Lactic Acid, Venous: 1 mmol/L (ref 0.5–1.9)
Lactic Acid, Venous: 1.2 mmol/L (ref 0.5–1.9)

## 2020-12-23 LAB — TSH: TSH: 1.071 u[IU]/mL (ref 0.350–4.500)

## 2020-12-23 LAB — MAGNESIUM: Magnesium: 2.4 mg/dL (ref 1.7–2.4)

## 2020-12-23 LAB — RESP PANEL BY RT-PCR (FLU A&B, COVID) ARPGX2
Influenza A by PCR: NEGATIVE
Influenza B by PCR: NEGATIVE
SARS Coronavirus 2 by RT PCR: NEGATIVE

## 2020-12-23 LAB — MRSA PCR SCREENING: MRSA by PCR: NEGATIVE

## 2020-12-23 MED ORDER — LEVETIRACETAM IN NACL 500 MG/100ML IV SOLN
500.0000 mg | Freq: Two times a day (BID) | INTRAVENOUS | Status: DC
  Administered 2020-12-23: 500 mg via INTRAVENOUS
  Filled 2020-12-23: qty 100

## 2020-12-23 MED ORDER — POTASSIUM CHLORIDE 10 MEQ/100ML IV SOLN
10.0000 meq | INTRAVENOUS | Status: DC
  Administered 2020-12-23 (×2): 10 meq via INTRAVENOUS
  Filled 2020-12-23 (×3): qty 100

## 2020-12-23 MED ORDER — ONDANSETRON HCL 4 MG PO TABS: 4.0000 mg | ORAL_TABLET | Freq: Four times a day (QID) | ORAL | Status: DC | PRN

## 2020-12-23 MED ORDER — LACTATED RINGERS IV SOLN: INTRAVENOUS | Status: DC

## 2020-12-23 MED ORDER — POTASSIUM CHLORIDE CRYS ER 20 MEQ PO TBCR
40.0000 meq | EXTENDED_RELEASE_TABLET | Freq: Once | ORAL | Status: AC
  Administered 2020-12-23: 40 meq via ORAL
  Filled 2020-12-23: qty 2

## 2020-12-23 MED ORDER — ACETAMINOPHEN 650 MG RE SUPP: 650.0000 mg | Freq: Four times a day (QID) | RECTAL | Status: DC | PRN

## 2020-12-23 MED ORDER — ACETAMINOPHEN 325 MG PO TABS: 650.0000 mg | ORAL_TABLET | Freq: Four times a day (QID) | ORAL | Status: DC | PRN

## 2020-12-23 MED ORDER — LORAZEPAM 2 MG/ML IJ SOLN: 2.0000 mg | INTRAMUSCULAR | Status: DC | PRN

## 2020-12-23 MED ORDER — ONDANSETRON HCL 4 MG/2ML IJ SOLN: 4.0000 mg | Freq: Four times a day (QID) | INTRAMUSCULAR | Status: DC | PRN

## 2020-12-23 NOTE — Plan of Care (Signed)
Patient left AMA   Problem: Education: Goal: Knowledge of General Education information will improve Description: Including pain rating scale, medication(s)/side effects and non-pharmacologic comfort measures Outcome: Not Applicable   Problem: Health Behavior/Discharge Planning: Goal: Ability to manage health-related needs will improve Outcome: Not Applicable   Problem: Clinical Measurements: Goal: Ability to maintain clinical measurements within normal limits will improve Outcome: Not Applicable Goal: Will remain free from infection Outcome: Not Applicable Goal: Diagnostic test results will improve Outcome: Not Applicable Goal: Respiratory complications will improve Outcome: Not Applicable Goal: Cardiovascular complication will be avoided Outcome: Not Applicable   Problem: Activity: Goal: Risk for activity intolerance will decrease Outcome: Not Applicable   Problem: Nutrition: Goal: Adequate nutrition will be maintained Outcome: Not Applicable   Problem: Coping: Goal: Level of anxiety will decrease Outcome: Not Applicable   Problem: Elimination: Goal: Will not experience complications related to bowel motility Outcome: Not Applicable Goal: Will not experience complications related to urinary retention Outcome: Not Applicable   Problem: Pain Managment: Goal: General experience of comfort will improve Outcome: Not Applicable   Problem: Safety: Goal: Ability to remain free from injury will improve Outcome: Not Applicable   Problem: Skin Integrity: Goal: Risk for impaired skin integrity will decrease Outcome: Not Applicable   Problem: Education: Goal: Expressions of having a comfortable level of knowledge regarding the disease process will increase Outcome: Not Applicable   Problem: Coping: Goal: Ability to adjust to condition or change in health will improve Outcome: Not Applicable Goal: Ability to identify appropriate support needs will improve Outcome:  Not Applicable   Problem: Health Behavior/Discharge Planning: Goal: Compliance with prescribed medication regimen will improve Outcome: Not Applicable   Problem: Medication: Goal: Risk for medication side effects will decrease Outcome: Not Applicable   Problem: Clinical Measurements: Goal: Complications related to the disease process, condition or treatment will be avoided or minimized Outcome: Not Applicable Goal: Diagnostic test results will improve Outcome: Not Applicable   Problem: Safety: Goal: Verbalization of understanding the information provided will improve Outcome: Not Applicable   Problem: Self-Concept: Goal: Level of anxiety will decrease Outcome: Not Applicable Goal: Ability to verbalize feelings about condition will improve Outcome: Not Applicable

## 2020-12-23 NOTE — Progress Notes (Signed)
At 1200 Patient requesting to be Discharged, states He "needs to go home for my children" MD notified.   Patient then began to get dressed and requested to leave AMA, patient educated about what that means, MD notified. Patient removed IV himself, AMA form signed at 12:57
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
## 2020-12-23 NOTE — Progress Notes (Addendum)
Telemetry called to inform RN pt monitored rhythm has gone into third degree heart block. Pt was assessed to be sleeping comfortably in bed with call light within reach and HR in 90's-100's. Dr. Lewis Moccasin was notified, he told writer to notify Dr. Loney Loh from floor coverage. Dr. Loney Loh paged and notified, no new orders received or response from MD yet. Tele calls RN to report pt rhythm is back into NSR/ST at 0513.Will continue to closely monitor order. Arabella Merles Isidoro Santillana RN  0534:MD response with new orders receive. Dionne Bucy RN

## 2020-12-23 NOTE — Progress Notes (Signed)
Overnight event  Telemetry called RN to report that the patient's heart rhythm was abnormal (third-degree heart block).  Patient asymptomatic and hemodynamically stable.  Heart rate currently in the 90-100 range.  Not on any AV nodal blocking agent.  -Stat EKG -Potassium 2.9 on morning labs, supplementation ordered -Consult cardiology in the morning

## 2020-12-23 NOTE — Discharge Summary (Addendum)
Physician Discharge Summary  Daniel Terry EHM:[id number] DOB: Aug 01, 1985 DOA: 12/22/2020  PCP: Pcp, No  Admit date: 12/22/2020 Discharge date: 12/23/2020  Admitted From: jail  Disposition:  home   Recommendations for Outpatient Follow-up:  F/u on medication adherence  LEFT THE HOSPITAL AGAINST MEDICAL ADVICE   Discharge Diagnoses:  Principal Problem:   Seizure (HCC) Active Problems:   Laceration of right eyebrow   Polysubstance abuse (HCC) Herniation of cervical disc Lactic acidosis Hypokalemia    Brief Summary: Daniel Terry is a 35 y.o. male with medical history significant for seizure disorder but has history of noncompliance per ER physician.  He presents form jail with seizures with head injury. He was noted to have 2 seizures in route as well when he was with EMS.  He was given Versed by EMS and later given Ativan and Geodon in the ER.  He sustained a laceration to the right forehead and had Steri-Strips placed.  CT of the head was unrevealing.  He was started on his home dose of Keppra and was admitted to the hospital.  It was suspected that he was not receiving this medication while in jail.  Hospital Course:  Seizures - Suspected to be due to lack of Keppra while in jail - He has had no further seizures since being admitted and resumed on Keppra 500 twice daily -Patient states he last had seizures about a month ago-states that he did not tell anyone about this -I requested a neurology eval to see if his dose needs to be adjusted but he decided to leave AGAINST MEDICAL ADVICE  Lactic acidosis - Lactic acid level was greater than 11 in the ED-suspected to be related to seizure-when rechecked it had improved to normal  Disc herniation - CT of the C-spine shows the following:C5-6 eccentric right paracentral disc herniation, possibly abutting the exiting C6 nerve root-the patient had no complaints of pain numbness or tingling when I saw him  Hypokalemia - The patient had  received 2 rounds of IV potassium and a dose of 40 mEq oral potassium-the plan was to recheck it prior to discharging him but this could not be done  Leukocytosis - Thought to be stress demargination  Polysubstance abuse - UDS positive for benzodiazepines cocaine and tetrahydrocannabinol   Discharge Exam: Vitals:   12/23/20 0823 12/23/20 1136  BP: 123/82 (!) 116/94  Pulse: 91 95  Resp: 18 18  Temp: 98.6 F (37 C) 99.3 F (37.4 C)  SpO2: 98% 100%   Vitals:   12/23/20 0146 12/23/20 0327 12/23/20 0823 12/23/20 1136  BP: 115/75 (!) 116/56 123/82 (!) 116/94  Pulse: 91 (!) 103 91 95  Resp: 19 20 18 18   Temp: 98.6 F (37 C) 98.7 F (37.1 C) 98.6 F (37 C) 99.3 F (37.4 C)  TempSrc: Oral Oral Oral Oral  SpO2: 100% 100% 98% 100%    General: Pt is alert, awake, not in acute distress Cardiovascular: RRR, S1/S2 +, no rubs, no gallops Respiratory: CTA bilaterally, no wheezing, no rhonchi Abdominal: Soft, NT, ND, bowel sounds + Extremities: no edema, no cyanosis   Discharge Instructions     Allergies  Allergen Reactions   Diphenhydramine Hcl     Other reaction(s): Lethargy (intolerance) Excessive drowsiness      CT Head Wo Contrast  Result Date: 12/23/2020 CLINICAL DATA:  Unwitnessed fall, seizure EXAM: CT HEAD WITHOUT CONTRAST CT CERVICAL SPINE WITHOUT CONTRAST TECHNIQUE: Multidetector CT imaging of the head and cervical spine was performed following the  standard protocol without intravenous contrast. Multiplanar CT image reconstructions of the cervical spine were also generated. COMPARISON:  CT head 09/24/2020 and 08/07/2020 FINDINGS: CT HEAD FINDINGS Brain: Focal encephalomalacia along the is again noted along the inferior surface and anterior pole of the right frontal lobe, unchanged from prior examination. No evidence of acute intracranial hemorrhage or infarct. No abnormal mass effect or midline shift. No abnormal intra or extra-axial mass lesion or fluid collection.  The ventricular size is normal. The cerebellum is unremarkable. Vascular: No asymmetric hyperdense vasculature at the skull base. Skull: Intact Sinuses/Orbits: At mild right preseptal soft tissue swelling. The intraorbital contents are unremarkable. There is extensive opacification of the ethmoid air cells bilaterally as well as moderate mucosal thickening involving the sphenoid and maxillary sinuses. Other: Mastoid air cells and middle ear cavities are clear. CT CERVICAL SPINE FINDINGS Alignment: Normal. Skull base and vertebrae: No acute fracture. No primary bone lesion or focal pathologic process. Soft tissues and spinal canal: No prevertebral fluid or swelling. No visible canal hematoma. Eccentric right paracentral disc herniation is seen at C5-6 possibly abutting the exiting C6 nerve root. Disc levels: Intervertebral disc heights are preserved. The prevertebral soft tissues are not thickened. Minimal uncovertebral arthrosis noted at C5-6. No bony encroachment of the neural foramina bilaterally. Upper chest: Unremarkable Other: None IMPRESSION: Mild right preseptal soft tissue swelling. No acute intracranial injury. No calvarial fracture. Stable right frontal encephalomalacia. Extensive paranasal sinus disease. No acute fracture or listhesis of the cervical spine. C5-6 eccentric right paracentral disc herniation, possibly abutting the exiting C6 nerve root. Electronically Signed   By: Helyn Numbers MD   On: 12/23/2020 01:00   CT Cervical Spine Wo Contrast  Result Date: 12/23/2020 CLINICAL DATA:  Unwitnessed fall, seizure EXAM: CT HEAD WITHOUT CONTRAST CT CERVICAL SPINE WITHOUT CONTRAST TECHNIQUE: Multidetector CT imaging of the head and cervical spine was performed following the standard protocol without intravenous contrast. Multiplanar CT image reconstructions of the cervical spine were also generated. COMPARISON:  CT head 09/24/2020 and 08/07/2020 FINDINGS: CT HEAD FINDINGS Brain: Focal encephalomalacia  along the is again noted along the inferior surface and anterior pole of the right frontal lobe, unchanged from prior examination. No evidence of acute intracranial hemorrhage or infarct. No abnormal mass effect or midline shift. No abnormal intra or extra-axial mass lesion or fluid collection. The ventricular size is normal. The cerebellum is unremarkable. Vascular: No asymmetric hyperdense vasculature at the skull base. Skull: Intact Sinuses/Orbits: At mild right preseptal soft tissue swelling. The intraorbital contents are unremarkable. There is extensive opacification of the ethmoid air cells bilaterally as well as moderate mucosal thickening involving the sphenoid and maxillary sinuses. Other: Mastoid air cells and middle ear cavities are clear. CT CERVICAL SPINE FINDINGS Alignment: Normal. Skull base and vertebrae: No acute fracture. No primary bone lesion or focal pathologic process. Soft tissues and spinal canal: No prevertebral fluid or swelling. No visible canal hematoma. Eccentric right paracentral disc herniation is seen at C5-6 possibly abutting the exiting C6 nerve root. Disc levels: Intervertebral disc heights are preserved. The prevertebral soft tissues are not thickened. Minimal uncovertebral arthrosis noted at C5-6. No bony encroachment of the neural foramina bilaterally. Upper chest: Unremarkable Other: None IMPRESSION: Mild right preseptal soft tissue swelling. No acute intracranial injury. No calvarial fracture. Stable right frontal encephalomalacia. Extensive paranasal sinus disease. No acute fracture or listhesis of the cervical spine. C5-6 eccentric right paracentral disc herniation, possibly abutting the exiting C6 nerve root. Electronically Signed   By:  Helyn Numbers MD   On: 12/23/2020 01:00     The results of significant diagnostics from this hospitalization (including imaging, microbiology, ancillary and laboratory) are listed below for reference.     Microbiology: Recent Results  (from the past 240 hour(s))  Resp Panel by RT-PCR (Flu A&B, Covid) Nasopharyngeal Swab     Status: None   Collection Time: 12/22/20 11:06 PM   Specimen: Nasopharyngeal Swab; Nasopharyngeal(NP) swabs in vial transport medium  Result Value Ref Range Status   SARS Coronavirus 2 by RT PCR NEGATIVE NEGATIVE Final    Comment: (NOTE) SARS-CoV-2 target nucleic acids are NOT DETECTED.  The SARS-CoV-2 RNA is generally detectable in upper respiratory specimens during the acute phase of infection. The lowest concentration of SARS-CoV-2 viral copies this assay can detect is 138 copies/mL. A negative result does not preclude SARS-Cov-2 infection and should not be used as the sole basis for treatment or other patient management decisions. A negative result may occur with  improper specimen collection/handling, submission of specimen other than nasopharyngeal swab, presence of viral mutation(s) within the areas targeted by this assay, and inadequate number of viral copies(<138 copies/mL). A negative result must be combined with clinical observations, patient history, and epidemiological information. The expected result is Negative.  Fact Sheet for Patients:  BloggerCourse.com  Fact Sheet for Healthcare Providers:  SeriousBroker.it  This test is no t yet approved or cleared by the [place] FDA and  has been authorized for detection and/or diagnosis of SARS-CoV-2 by FDA under an Emergency Use Authorization (EUA). This EUA will remain  in effect (meaning this test can be used) for the duration of the COVID-19 declaration under Section 564(b)(1) of the Act, 21 U.S.C.section 360bbb-3(b)(1), unless the authorization is terminated  or revoked sooner.       Influenza A by PCR NEGATIVE NEGATIVE Final   Influenza B by PCR NEGATIVE NEGATIVE Final    Comment: (NOTE) The Xpert Xpress SARS-CoV-2/FLU/RSV plus assay is intended as an aid in the  diagnosis of influenza from Nasopharyngeal swab specimens and should not be used as a sole basis for treatment. Nasal washings and aspirates are unacceptable for Xpert Xpress SARS-CoV-2/FLU/RSV testing.  Fact Sheet for Patients: BloggerCourse.com  Fact Sheet for Healthcare Providers: SeriousBroker.it  This test is not yet approved or cleared by the [place] FDA and has been authorized for detection and/or diagnosis of SARS-CoV-2 by FDA under an Emergency Use Authorization (EUA). This EUA will remain in effect (meaning this test can be used) for the duration of the COVID-19 declaration under Section 564(b)(1) of the Act, 21 U.S.C. section 360bbb-3(b)(1), unless the authorization is terminated or revoked.  Performed at [facility], [address], [place], [place] [address]   MRSA PCR Screening     Status: None   Collection Time: 12/23/20  3:19 AM   Specimen: Nasal Mucosa; Nasopharyngeal  Result Value Ref Range Status   MRSA by PCR NEGATIVE NEGATIVE Final    Comment:        The GeneXpert MRSA Assay (FDA approved for NASAL specimens only), is one component of a comprehensive MRSA colonization surveillance program. It is not intended to diagnose MRSA infection nor to guide or monitor treatment for MRSA infections. Performed at [facility], [address], [place], [place] [address]      Labs: BNP (last 3 results) No results for input(s): BNP in the last 8760 hours. Basic Metabolic Panel: Recent Labs  Lab 12/22/20 2048 12/22/20 2306  12/23/20 0302  NA 137 140 136  K 3.9 3.9 2.9*  CL 101 108 106  CO2 <7*  --  20*  GLUCOSE 277* 145* 100*  BUN 12 9 10   CREATININE 1.62* 0.70 1.39*  CALCIUM 9.0  --  8.7*  MG  --   --  2.4   Liver Function Tests: Recent Labs  Lab 12/22/20 2048  AST 36  ALT 28  ALKPHOS 97  BILITOT 0.5  PROT 7.8  ALBUMIN 4.5   No results for input(s): LIPASE, AMYLASE in the  last 168 hours. No results for input(s): AMMONIA in the last 168 hours. CBC: Recent Labs  Lab 12/22/20 2042 12/22/20 2306 12/23/20 0302  WBC 16.3*  --  19.7*  HGB 14.2 9.9* 12.5*  HCT 46.5 29.0* 35.6*  MCV 102.0*  --  89.2  PLT 399  --  243   Cardiac Enzymes: No results for input(s): CKTOTAL, CKMB, CKMBINDEX, TROPONINI in the last 168 hours. BNP: Invalid input(s): POCBNP CBG: Recent Labs  Lab 12/22/20 2120  GLUCAP 219*   D-Dimer No results for input(s): DDIMER in the last 72 hours. Hgb A1c No results for input(s): HGBA1C in the last 72 hours. Lipid Profile No results for input(s): CHOL, HDL, LDLCALC, TRIG, CHOLHDL, LDLDIRECT in the last 72 hours. Thyroid function studies Recent Labs    12/23/20 0627  TSH 1.071   Anemia work up No results for input(s): VITAMINB12, FOLATE, FERRITIN, TIBC, IRON, RETICCTPCT in the last 72 hours. Urinalysis    Component Value Date/Time   COLORURINE YELLOW 11/15/2020 1750   APPEARANCEUR CLEAR 11/15/2020 1750   LABSPEC 1.013 11/15/2020 1750   PHURINE 5.0 11/15/2020 1750   GLUCOSEU 50 (A) 11/15/2020 1750   HGBUR MODERATE (A) 11/15/2020 1750   BILIRUBINUR NEGATIVE 11/15/2020 1750   KETONESUR NEGATIVE 11/15/2020 1750   PROTEINUR 30 (A) 11/15/2020 1750   UROBILINOGEN 0.2 03/25/2010 2223   NITRITE NEGATIVE 11/15/2020 1750   LEUKOCYTESUR NEGATIVE 11/15/2020 1750   Sepsis Labs Invalid input(s): PROCALCITONIN,  WBC,  LACTICIDVEN Microbiology Recent Results (from the past 240 hour(s))  Resp Panel by RT-PCR (Flu A&B, Covid) Nasopharyngeal Swab     Status: None   Collection Time: 12/22/20 11:06 PM   Specimen: Nasopharyngeal Swab; Nasopharyngeal(NP) swabs in vial transport medium  Result Value Ref Range Status   SARS Coronavirus 2 by RT PCR NEGATIVE NEGATIVE Final    Comment: (NOTE) SARS-CoV-2 target nucleic acids are NOT DETECTED.  The SARS-CoV-2 RNA is generally detectable in upper respiratory specimens during the acute phase of  infection. The lowest concentration of SARS-CoV-2 viral copies this assay can detect is 138 copies/mL. A negative result does not preclude SARS-Cov-2 infection and should not be used as the sole basis for treatment or other patient management decisions. A negative result may occur with  improper specimen collection/handling, submission of specimen other than nasopharyngeal swab, presence of viral mutation(s) within the areas targeted by this assay, and inadequate number of viral copies(<138 copies/mL). A negative result must be combined with clinical observations, patient history, and epidemiological information. The expected result is Negative.  Fact Sheet for Patients:  BloggerCourse.comhttps://www.fda.gov/media/152166/download  Fact Sheet for Healthcare Providers:  SeriousBroker.ithttps://www.fda.gov/media/152162/download  This test is no t yet approved or cleared by the [place] FDA and  has been authorized for detection and/or diagnosis of SARS-CoV-2 by FDA under an Emergency Use Authorization (EUA). This EUA will remain  in effect (meaning this test can be used) for the duration of the COVID-19 declaration under  Section 564(b)(1) of the Act, 21 U.S.C.section 360bbb-3(b)(1), unless the authorization is terminated  or revoked sooner.       Influenza A by PCR NEGATIVE NEGATIVE Final   Influenza B by PCR NEGATIVE NEGATIVE Final    Comment: (NOTE) The Xpert Xpress SARS-CoV-2/FLU/RSV plus assay is intended as an aid in the diagnosis of influenza from Nasopharyngeal swab specimens and should not be used as a sole basis for treatment. Nasal washings and aspirates are unacceptable for Xpert Xpress SARS-CoV-2/FLU/RSV testing.  Fact Sheet for Patients: BloggerCourse.com  Fact Sheet for Healthcare Providers: SeriousBroker.it  This test is not yet approved or cleared by the [place] FDA and has been authorized for detection and/or diagnosis of SARS-CoV-2  by FDA under an Emergency Use Authorization (EUA). This EUA will remain in effect (meaning this test can be used) for the duration of the COVID-19 declaration under Section 564(b)(1) of the Act, 21 U.S.C. section 360bbb-3(b)(1), unless the authorization is terminated or revoked.  Performed at [facility], [address], [place], [place] [address]   MRSA PCR Screening     Status: None   Collection Time: 12/23/20  3:19 AM   Specimen: Nasal Mucosa; Nasopharyngeal  Result Value Ref Range Status   MRSA by PCR NEGATIVE NEGATIVE Final    Comment:        The GeneXpert MRSA Assay (FDA approved for NASAL specimens only), is one component of a comprehensive MRSA colonization surveillance program. It is not intended to diagnose MRSA infection nor to guide or monitor treatment for MRSA infections. Performed at [facility], [address] [place], [place] [address]      Time coordinating discharge in minutes: 45  SIGNED:   Calvert Cantor, MD  [facility] 12/23/2020, 5:31 PM

## 2020-12-29 ENCOUNTER — Inpatient Hospital Stay: Payer: Self-pay | Admitting: Physician Assistant

## 2020-12-29 NOTE — Progress Notes (Deleted)
Patient ID: Daniel Terry, male   DOB: 1986/05/20, 35 y.o.   MRN: [id number]  After hospitalization 6/8-12/23/2020 Discharge Diagnoses:  Principal Problem:   Seizure [facility]) Active Problems:   Laceration of right eyebrow   Polysubstance abuse (HCC) Herniation of cervical disc Lactic acidosis Hypokalemia       Brief Summary: Daniel Terry is a 35 y.o. male with medical history significant for seizure disorder but has history of noncompliance per ER physician.  [facility] jail with seizures with head injury. He was noted to have 2 seizures in route as well when he was with EMS. He was given Versed by EMS and later given Ativan and Geodon in the ER.  He sustained a laceration to the right forehead and had Steri-Strips placed.  CT of the head was unrevealing.  He was started on his home dose of Keppra and was admitted to the hospital.  It was suspected that he was not receiving this medication while in jail.   Hospital Course:  Seizures - Suspected to be due to lack of Keppra while in jail - He has had no further seizures since being admitted and resumed on Keppra 500 twice daily -Patient states he last had seizures about a month ago-states that he did not tell anyone about this -I requested a neurology eval to see if his dose needs to be adjusted but he decided to leave AGAINST MEDICAL ADVICE   Lactic acidosis - Lactic acid level was greater than 11 in the ED-suspected to be related to seizure-when rechecked it had improved to normal   Disc herniation - CT of the C-spine shows the following:C5-6 eccentric right paracentral disc herniation, possibly abutting the exiting C6 nerve root-the patient had no complaints of pain numbness or tingling when I saw him   Hypokalemia - The patient had received 2 rounds of IV potassium and a dose of 40 mEq oral potassium-the plan was to recheck it prior to discharging him but this could not be done   Leukocytosis - Thought to be stress  demargination   Polysubstance abuse - UDS positive for benzodiazepines cocaine and tetrahydrocannabinol

## 2021-09-16 ENCOUNTER — Other Ambulatory Visit (HOSPITAL_COMMUNITY): Payer: Self-pay

## 2022-01-17 IMAGING — DX DG CHEST 2V
2 series · 2 of 2 positions shown · non-contrast
Comparison: Radiograph 08/07/2020, CT 03/25/2010

CLINICAL DATA: Chest pain, shortness of breath

EXAM:
CHEST - 2 VIEW

[chest pa]
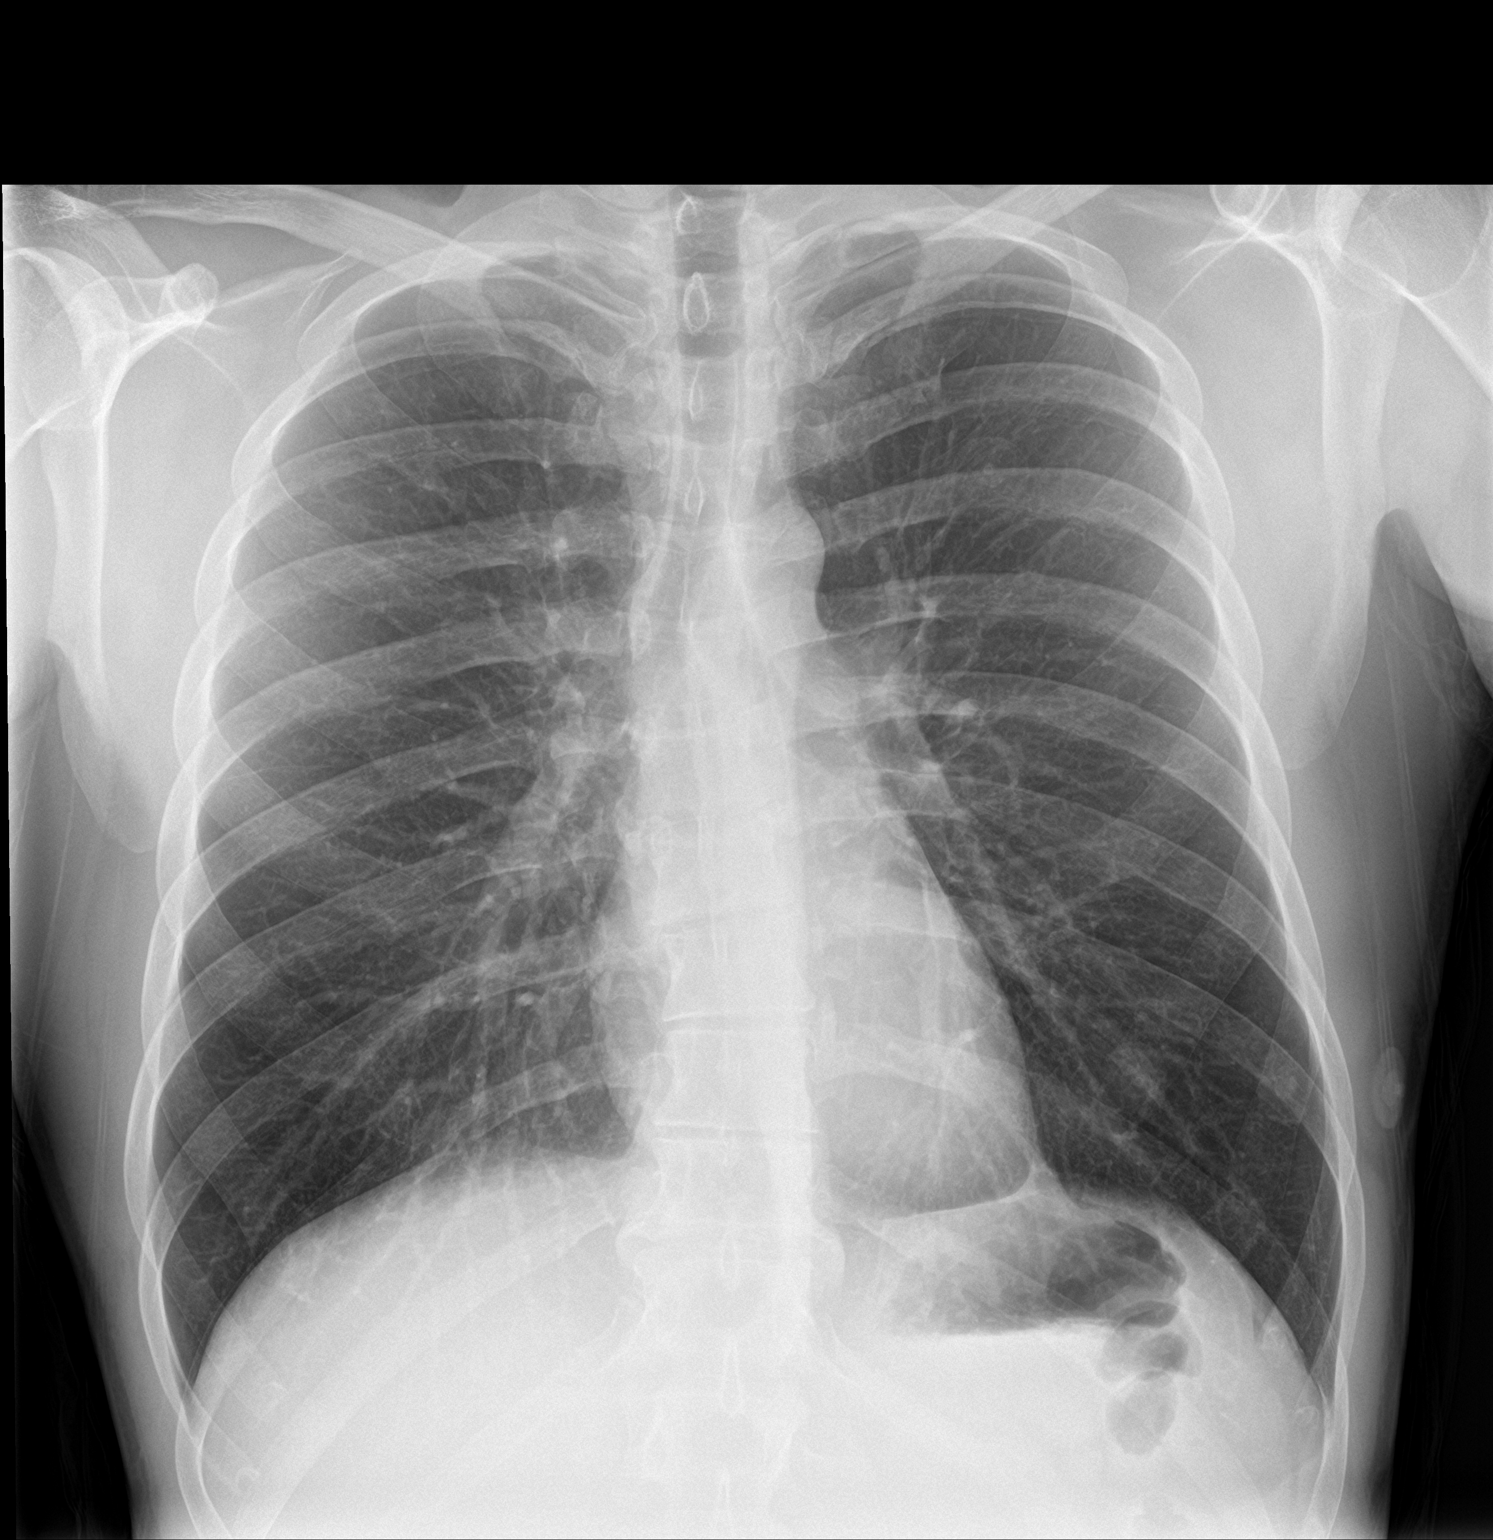

[chest lat]
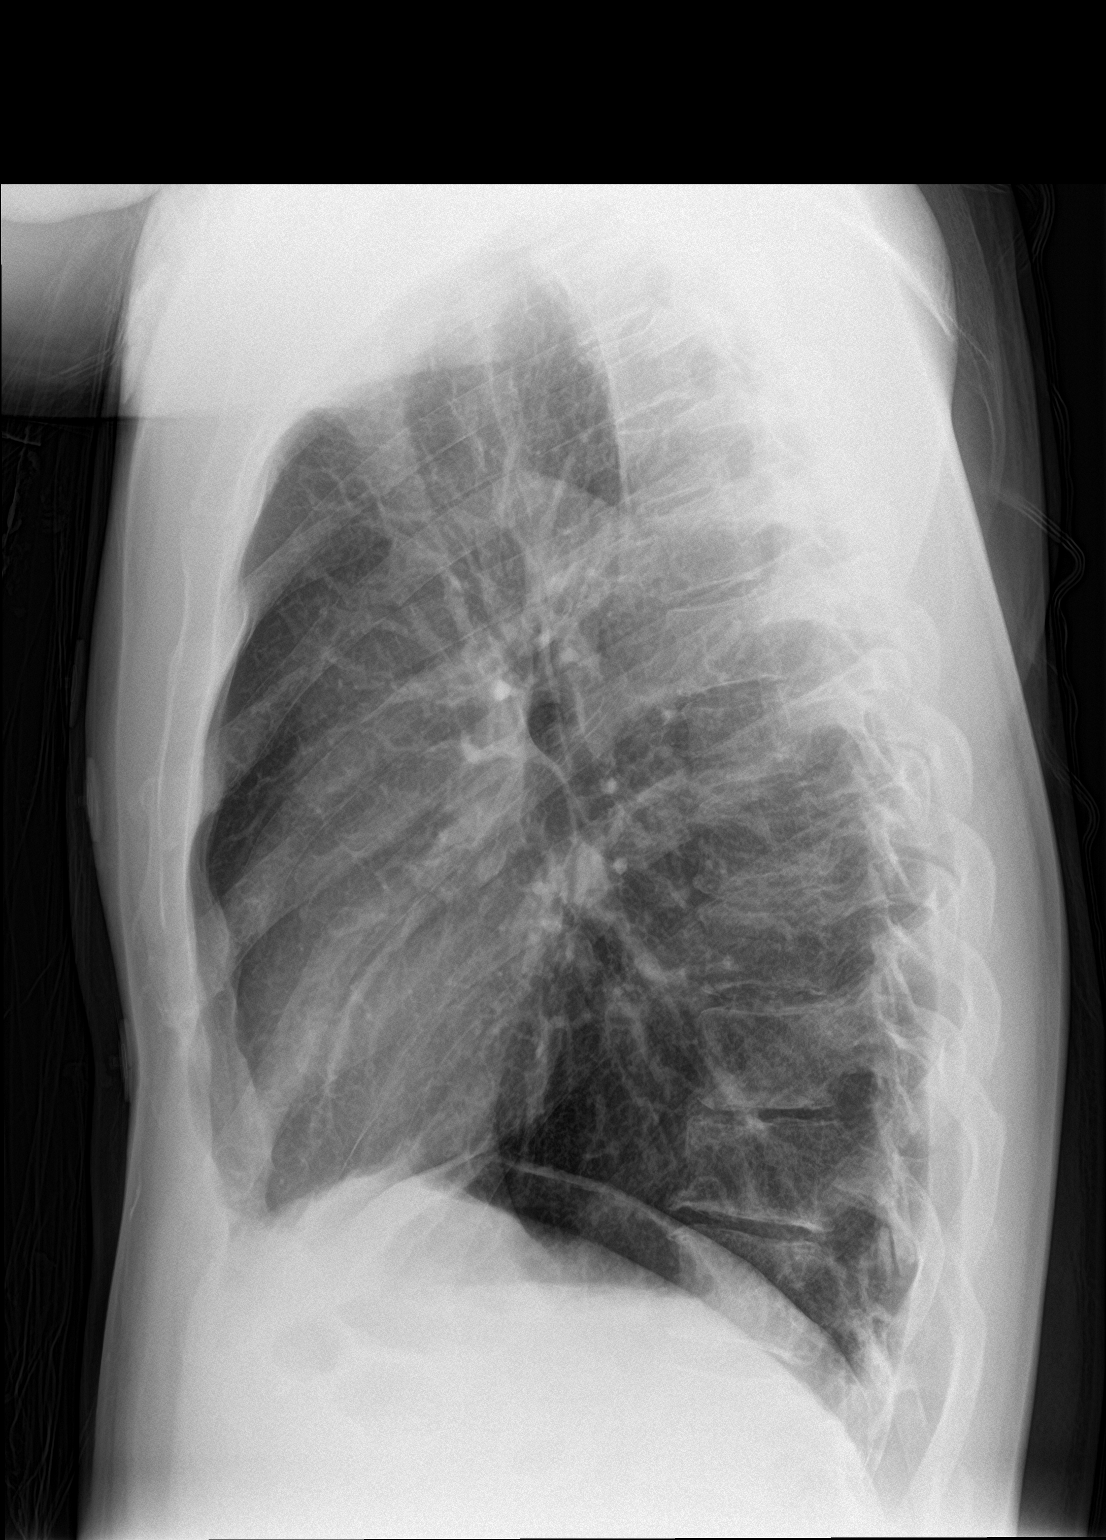

[2 of 2 positions shown; findings below may reference images not displayed]

FINDINGS: Mild hyperinflation, similar to prior. No consolidation, features of
edema, pneumothorax, or effusion. The cardiomediastinal contours are
unremarkable. No acute osseous or soft tissue abnormality.
IMPRESSION: No acute cardiopulmonary abnormality.

## 2022-08-10 ENCOUNTER — Other Ambulatory Visit: Payer: Self-pay

## 2022-08-10 ENCOUNTER — Encounter: Payer: Self-pay | Admitting: Emergency Medicine

## 2022-08-10 ENCOUNTER — Emergency Department (HOSPITAL_COMMUNITY): Payer: Commercial Managed Care - HMO

## 2022-08-10 ENCOUNTER — Inpatient Hospital Stay (HOSPITAL_COMMUNITY)
Admission: EM | Admit: 2022-08-10 | Discharge: 2022-08-15 | DRG: 603 | Disposition: A | Discharge status: Home / Self Care | Payer: Commercial Managed Care - HMO | Attending: Family Medicine | Admitting: Family Medicine

## 2022-08-10 ENCOUNTER — Ambulatory Visit
Admission: EM | Admit: 2022-08-10 | Discharge: 2022-08-10 | Disposition: A | Discharge status: Home / Self Care | Payer: Commercial Managed Care - HMO

## 2022-08-10 DIAGNOSIS — Z87820 Personal history of traumatic brain injury: Secondary | ICD-10-CM

## 2022-08-10 DIAGNOSIS — G40909 Epilepsy, unspecified, not intractable, without status epilepticus: Secondary | ICD-10-CM | POA: Diagnosis present

## 2022-08-10 DIAGNOSIS — A419 Sepsis, unspecified organism: Secondary | ICD-10-CM | POA: Diagnosis not present

## 2022-08-10 DIAGNOSIS — Z23 Encounter for immunization: Secondary | ICD-10-CM

## 2022-08-10 DIAGNOSIS — R569 Unspecified convulsions: Secondary | ICD-10-CM

## 2022-08-10 DIAGNOSIS — L02511 Cutaneous abscess of right hand: Secondary | ICD-10-CM | POA: Diagnosis present

## 2022-08-10 DIAGNOSIS — R030 Elevated blood-pressure reading, without diagnosis of hypertension: Secondary | ICD-10-CM | POA: Diagnosis present

## 2022-08-10 DIAGNOSIS — R599 Enlarged lymph nodes, unspecified: Secondary | ICD-10-CM | POA: Diagnosis present

## 2022-08-10 DIAGNOSIS — Z8679 Personal history of other diseases of the circulatory system: Secondary | ICD-10-CM

## 2022-08-10 DIAGNOSIS — L03011 Cellulitis of right finger: Secondary | ICD-10-CM | POA: Diagnosis present

## 2022-08-10 DIAGNOSIS — L0390 Cellulitis, unspecified: Secondary | ICD-10-CM | POA: Diagnosis present

## 2022-08-10 DIAGNOSIS — S069XAA Unspecified intracranial injury with loss of consciousness status unknown, initial encounter: Secondary | ICD-10-CM | POA: Insufficient documentation

## 2022-08-10 DIAGNOSIS — Z79899 Other long term (current) drug therapy: Secondary | ICD-10-CM | POA: Diagnosis not present

## 2022-08-10 DIAGNOSIS — R410 Disorientation, unspecified: Secondary | ICD-10-CM | POA: Diagnosis not present

## 2022-08-10 DIAGNOSIS — L02512 Cutaneous abscess of left hand: Secondary | ICD-10-CM

## 2022-08-10 DIAGNOSIS — Z87891 Personal history of nicotine dependence: Secondary | ICD-10-CM | POA: Diagnosis not present

## 2022-08-10 DIAGNOSIS — F1011 Alcohol abuse, in remission: Secondary | ICD-10-CM | POA: Insufficient documentation

## 2022-08-10 DIAGNOSIS — Z8249 Family history of ischemic heart disease and other diseases of the circulatory system: Secondary | ICD-10-CM

## 2022-08-10 DIAGNOSIS — Z888 Allergy status to other drugs, medicaments and biological substances status: Secondary | ICD-10-CM

## 2022-08-10 DIAGNOSIS — F1910 Other psychoactive substance abuse, uncomplicated: Secondary | ICD-10-CM | POA: Diagnosis present

## 2022-08-10 DIAGNOSIS — E871 Hypo-osmolality and hyponatremia: Secondary | ICD-10-CM | POA: Diagnosis not present

## 2022-08-10 LAB — CBC WITH DIFFERENTIAL/PLATELET
Abs Immature Granulocytes: 0.14 10*3/uL — ABNORMAL HIGH (ref 0.00–0.07)
Basophils Absolute: 0.1 10*3/uL (ref 0.0–0.1)
Basophils Relative: 0 %
Eosinophils Absolute: 0.2 10*3/uL (ref 0.0–0.5)
Eosinophils Relative: 1 %
HCT: 41.1 % (ref 39.0–52.0)
Hemoglobin: 14 g/dL (ref 13.0–17.0)
Immature Granulocytes: 1 %
Lymphocytes Relative: 9 %
Lymphs Abs: 2.3 10*3/uL (ref 0.7–4.0)
MCH: 30.9 pg (ref 26.0–34.0)
MCHC: 34.1 g/dL (ref 30.0–36.0)
MCV: 90.7 fL (ref 80.0–100.0)
Monocytes Absolute: 1.7 10*3/uL — ABNORMAL HIGH (ref 0.1–1.0)
Monocytes Relative: 7 %
Neutro Abs: 20.7 10*3/uL — ABNORMAL HIGH (ref 1.7–7.7)
Neutrophils Relative %: 82 %
Platelets: 399 10*3/uL (ref 150–400)
RBC: 4.53 MIL/uL (ref 4.22–5.81)
RDW: 12 % (ref 11.5–15.5)
WBC: 25.1 10*3/uL — ABNORMAL HIGH (ref 4.0–10.5)
nRBC: 0 % (ref 0.0–0.2)

## 2022-08-10 LAB — LACTIC ACID, PLASMA
Lactic Acid, Venous: 0.9 mmol/L (ref 0.5–1.9)
Lactic Acid, Venous: 1.5 mmol/L (ref 0.5–1.9)

## 2022-08-10 LAB — CREATININE, SERUM
Creatinine, Ser: 0.8 mg/dL (ref 0.61–1.24)
GFR, Estimated: >60 mL/min (ref >60)

## 2022-08-10 LAB — CBC
HCT: 38.8 % — ABNORMAL LOW (ref 39.0–52.0)
Hemoglobin: 13.2 g/dL (ref 13.0–17.0)
MCH: 30.8 pg (ref 26.0–34.0)
MCHC: 34 g/dL (ref 30.0–36.0)
MCV: 90.4 fL (ref 80.0–100.0)
Platelets: 387 10*3/uL (ref 150–400)
RBC: 4.29 MIL/uL (ref 4.22–5.81)
RDW: 12 % (ref 11.5–15.5)
WBC: 22.4 10*3/uL — ABNORMAL HIGH (ref 4.0–10.5)
nRBC: 0 % (ref 0.0–0.2)

## 2022-08-10 LAB — COMPREHENSIVE METABOLIC PANEL
ALT: 27 U/L (ref 0–44)
AST: 20 U/L (ref 15–41)
Albumin: 3.8 g/dL (ref 3.5–5.0)
Alkaline Phosphatase: 97 U/L (ref 38–126)
Anion gap: 9 (ref 5–15)
BUN: 8 mg/dL (ref 6–20)
CO2: 26 mmol/L (ref 22–32)
Calcium: 9 mg/dL (ref 8.9–10.3)
Chloride: 95 mmol/L — ABNORMAL LOW (ref 98–111)
Creatinine, Ser: 0.78 mg/dL (ref 0.61–1.24)
GFR, Estimated: >60 mL/min (ref >60)
Glucose, Bld: 119 mg/dL — ABNORMAL HIGH (ref 70–99)
Potassium: 3.6 mmol/L (ref 3.5–5.1)
Sodium: 130 mmol/L — ABNORMAL LOW (ref 135–145)
Total Bilirubin: 0.5 mg/dL (ref 0.3–1.2)
Total Protein: 8 g/dL (ref 6.5–8.1)

## 2022-08-10 LAB — RAPID URINE DRUG SCREEN, HOSP PERFORMED
Amphetamines: POSITIVE — AB
Barbiturates: NOT DETECTED
Benzodiazepines: POSITIVE — AB
Cocaine: NOT DETECTED
Opiates: POSITIVE — AB
Tetrahydrocannabinol: POSITIVE — AB

## 2022-08-10 LAB — ETHANOL: Alcohol, Ethyl (B): <10 mg/dL (ref <10)

## 2022-08-10 MED ORDER — ITRACONAZOLE 100 MG PO CAPS
200.0000 mg | ORAL_CAPSULE | Freq: Two times a day (BID) | ORAL | Status: DC
  Administered 2022-08-10 – 2022-08-14 (×8): 200 mg via ORAL
  Filled 2022-08-10 (×11): qty 2

## 2022-08-10 MED ORDER — POTASSIUM CHLORIDE IN NACL 20-0.9 MEQ/L-% IV SOLN
INTRAVENOUS | Status: DC
  Filled 2022-08-10: qty 1000

## 2022-08-10 MED ORDER — TETANUS-DIPHTH-ACELL PERTUSSIS 5-2.5-18.5 LF-MCG/0.5 IM SUSY
0.5000 mL | PREFILLED_SYRINGE | Freq: Once | INTRAMUSCULAR | Status: AC
  Administered 2022-08-10: 0.5 mL via INTRAMUSCULAR
  Filled 2022-08-10: qty 0.5

## 2022-08-10 MED ORDER — NICOTINE 14 MG/24HR TD PT24
14.0000 mg | MEDICATED_PATCH | Freq: Every day | TRANSDERMAL | Status: DC
  Administered 2022-08-11 – 2022-08-15 (×6): 14 mg via TRANSDERMAL
  Filled 2022-08-10 (×6): qty 1

## 2022-08-10 MED ORDER — ITRACONAZOLE 100 MG PO CAPS
200.0000 mg | ORAL_CAPSULE | Freq: Two times a day (BID) | ORAL | Status: DC
  Filled 2022-08-10: qty 2

## 2022-08-10 MED ORDER — ACETAMINOPHEN 325 MG PO TABS
650.0000 mg | ORAL_TABLET | Freq: Four times a day (QID) | ORAL | Status: DC | PRN
  Administered 2022-08-11 – 2022-08-13 (×2): 650 mg via ORAL
  Filled 2022-08-10 (×2): qty 2

## 2022-08-10 MED ORDER — HYDROMORPHONE HCL 1 MG/ML IJ SOLN
1.0000 mg | Freq: Once | INTRAMUSCULAR | Status: AC
  Administered 2022-08-10: 1 mg via INTRAVENOUS
  Filled 2022-08-10: qty 1

## 2022-08-10 MED ORDER — LIDOCAINE HCL (PF) 1 % IJ SOLN
30.0000 mL | Freq: Once | INTRAMUSCULAR | Status: AC
  Administered 2022-08-10: 30 mL
  Filled 2022-08-10: qty 30

## 2022-08-10 MED ORDER — MELATONIN 5 MG PO TABS
5.0000 mg | ORAL_TABLET | Freq: Every day | ORAL | Status: DC
  Administered 2022-08-11 – 2022-08-14 (×5): 5 mg via ORAL
  Filled 2022-08-10 (×5): qty 1

## 2022-08-10 MED ORDER — ONDANSETRON HCL 4 MG/2ML IJ SOLN
4.0000 mg | Freq: Four times a day (QID) | INTRAMUSCULAR | Status: DC | PRN
  Administered 2022-08-10 – 2022-08-11 (×2): 4 mg via INTRAVENOUS
  Filled 2022-08-10 (×2): qty 2

## 2022-08-10 MED ORDER — HYDROMORPHONE HCL 1 MG/ML IJ SOLN: 0.5000 mg | Freq: Once | INTRAMUSCULAR | Status: DC

## 2022-08-10 MED ORDER — IOHEXOL 300 MG/ML  SOLN
100.0000 mL | Freq: Once | INTRAMUSCULAR | Status: AC | PRN
  Administered 2022-08-10: 100 mL via INTRAVENOUS

## 2022-08-10 MED ORDER — MORPHINE SULFATE (PF) 2 MG/ML IV SOLN
2.0000 mg | INTRAVENOUS | Status: DC | PRN
  Administered 2022-08-10 – 2022-08-13 (×12): 2 mg via INTRAVENOUS
  Filled 2022-08-10 (×12): qty 1

## 2022-08-10 MED ORDER — PIPERACILLIN-TAZOBACTAM 3.375 G IVPB 30 MIN
3.3750 g | Freq: Once | INTRAVENOUS | Status: AC
  Administered 2022-08-10: 3.375 g via INTRAVENOUS
  Filled 2022-08-10: qty 50

## 2022-08-10 MED ORDER — SENNOSIDES-DOCUSATE SODIUM 8.6-50 MG PO TABS: 1.0000 | ORAL_TABLET | Freq: Every evening | ORAL | Status: DC | PRN

## 2022-08-10 MED ORDER — VANCOMYCIN HCL 1250 MG/250ML IV SOLN
1250.0000 mg | Freq: Two times a day (BID) | INTRAVENOUS | Status: DC
  Administered 2022-08-11 – 2022-08-14 (×7): 1250 mg via INTRAVENOUS
  Filled 2022-08-10 (×8): qty 250

## 2022-08-10 MED ORDER — VANCOMYCIN HCL IN DEXTROSE 1-5 GM/200ML-% IV SOLN
1000.0000 mg | Freq: Once | INTRAVENOUS | Status: AC
  Administered 2022-08-10: 1000 mg via INTRAVENOUS
  Filled 2022-08-10: qty 200

## 2022-08-10 MED ORDER — ACETAMINOPHEN 650 MG RE SUPP: 650.0000 mg | Freq: Four times a day (QID) | RECTAL | Status: DC | PRN

## 2022-08-10 MED ORDER — PIPERACILLIN-TAZOBACTAM 3.375 G IVPB
3.3750 g | Freq: Three times a day (TID) | INTRAVENOUS | Status: DC
  Administered 2022-08-10 – 2022-08-15 (×14): 3.375 g via INTRAVENOUS
  Filled 2022-08-10 (×14): qty 50

## 2022-08-10 MED ORDER — HYDRALAZINE HCL 20 MG/ML IJ SOLN: 5.0000 mg | INTRAMUSCULAR | Status: DC | PRN

## 2022-08-10 MED ORDER — ENOXAPARIN SODIUM 40 MG/0.4ML IJ SOSY
40.0000 mg | PREFILLED_SYRINGE | INTRAMUSCULAR | Status: DC
  Administered 2022-08-10 – 2022-08-14 (×4): 40 mg via SUBCUTANEOUS
  Filled 2022-08-10 (×5): qty 0.4

## 2022-08-10 MED ORDER — ONDANSETRON HCL 4 MG PO TABS: 4.0000 mg | ORAL_TABLET | Freq: Four times a day (QID) | ORAL | Status: DC | PRN

## 2022-08-10 MED ORDER — MORPHINE SULFATE (PF) 2 MG/ML IV SOLN: 2.0000 mg | INTRAVENOUS | Status: DC

## 2022-08-10 MED ORDER — LEVETIRACETAM 500 MG PO TABS: 500.0000 mg | ORAL_TABLET | Freq: Two times a day (BID) | ORAL | Status: DC

## 2022-08-10 MED ORDER — MORPHINE SULFATE (PF) 2 MG/ML IV SOLN
2.0000 mg | Freq: Once | INTRAVENOUS | Status: AC
  Administered 2022-08-10: 2 mg via INTRAVENOUS
  Filled 2022-08-10: qty 1

## 2022-08-10 MED ORDER — MELATONIN 3 MG PO TABS: 3.0000 mg | ORAL_TABLET | Freq: Every day | ORAL | Status: DC

## 2022-08-10 MED ORDER — HYDROMORPHONE HCL 1 MG/ML IJ SOLN: 1.0000 mg | Freq: Once | INTRAMUSCULAR | Status: DC

## 2022-08-10 NOTE — ED Triage Notes (Signed)
Knocked a cactus over and tried to catch it four days ago.   Entire hand swollen. Ulceration, drainage present to right 4th finger. States he took a bobby pin and tried to dig out the spikes a couple days ago without improvement. Does not know when last tetanus shot was. Reports lumps in his armpit

## 2022-08-10 NOTE — ED Triage Notes (Signed)
Right ring finger and right hand swelling x 4 days. Pt states he grabbed a cactus 4 days ago and started getting worse over the last few days

## 2022-08-10 NOTE — ED Provider Triage Note (Signed)
Emergency Medicine Provider Triage Evaluation Note  Daniel Terry , a 37 y.o. male  was evaluated in triage.  Pt complains of right hand swelling, draining and pain starting from the 4th finger.  Patient was injured by a cactus 4 days ago and states his hand immediately swelled.  He did attempt to drain it at home.  He states his pain is now up his entire arm and he has a swollen, tender axillary lymph node.  Patient was seen at [facility] and sent to ED for infection workup/concern for sepsis.   Review of Systems  Positive: As above Negative: As above  Physical Exam  BP (!) 158/103 (BP Location: Left Arm)   Pulse (!) 105   Temp 97.8 F (36.6 C) (Oral)   Resp 18   Ht 5\' 6"  (1.676 m)   Wt 72.4 kg   SpO2 100%   BMI 25.76 kg/m  Gen:   Awake, no distress   Resp:  Normal effort  MSK:   Moves extremities without difficulty  Other:  Enlarged R axillary lymph node that is TTP, erythema and edema to right hand with wounds to 4th digit (see photos)        Medical Decision Making  Medically screening exam initiated at 4:44 PM.  Appropriate orders placed.  Daniel Terry was informed that the remainder of the evaluation will be completed by another provider, this initial triage assessment does not replace that evaluation, and the importance of remaining in the ED until their evaluation is complete.     Theressa Stamps R, [place] 08/10/22 [phone]

## 2022-08-10 NOTE — Consult Note (Signed)
Orthopaedic Surgery Hand and Upper Extremity History and Physical Examination 08/10/2022  Referring Provider: No referring provider defined for this encounter.  CC: Right ring finger cellulitis and abscess  HPI: Daniel Terry is a 37 y.o. male who was stuck by a cactus 5 days and developed a worsening infection in his ring finger.  Now unable to move the finger.  Reports having a "golf ball" sized swollen lymph node in axilla.   Past Medical History: Past Medical History:  Diagnosis Date   Seizures ([place])      Medications: Scheduled Meds:  enoxaparin (LOVENOX) injection  40 mg Subcutaneous Q24H   levETIRAcetam  500 mg Oral BID   Continuous Infusions:  0.9 % NaCl with KCl 20 mEq / L     piperacillin-tazobactam (ZOSYN)  IV     PRN Meds:.acetaminophen **OR** acetaminophen, hydrALAZINE, morphine injection, ondansetron **OR** ondansetron (ZOFRAN) IV, senna-docusate  Allergies: Allergies as of 08/10/2022 - Review Complete 08/10/2022  Allergen Reaction Noted   Diphenhydramine hcl  08/03/2019    Past Surgical History: No past surgical history on file.   Social History: Social History   Occupational History   Not on file  Tobacco Use   Smoking status: Former   Smokeless tobacco: Never  Substance and Sexual Activity   Alcohol use: No   Drug use: Not on file   Sexual activity: Not on file     Family History: No family history on file. Otherwise, no relevant orthopaedic family history  ROS: Review of Systems: All systems reviewed and are negative except that mentioned in HPI  Work/Sport/Hobbies: See HPI  Physical Examination: Vitals:   08/10/22 1627 08/10/22 2017  BP: (!) 158/103   Pulse: (!) 105   Resp: 18   Temp: 97.8 F (36.6 C) 98.4 F (36.9 C)  SpO2: 100%    Constitutional: Awake, alert.  WN/WD Appearance: healthy, no acute distress, well-groomed Affect: Normal HEENT: EOMI, mucous membranes moist CV: RRR Pulm: breathing comfortably  Neck:    FROM, no pain  Right Upper Extremity / Hand Ring finger is erythematous and swollen from the level of the DIP joint and proximal with some skin sloughing over the PIP joint and proximal phalanx.  Erythema extends to volar surface and  dorsal hand.  Unable to flex or extend the digit.  Entire digit is tender, worse on the dorsal surface.  Non tender along the flexor tendon sheath in the palm.  Sensation intact in median, radial, and ulnar distributions.  Motor intact in AIN, PIN, ulnar distributions.  Fingers warm and well perfused.  Axilla palpated for LAD, patient reported tenderness but no LAD noted.    Pertinent Labs: n/a  Imaging: I have personally reviewed the following studies: XR and CT obtained by ED were reviewed and demonstrate a dorsal abscess and cellulitis.  Radiology report mentions concern for 4th MCP swelling but I do not think there is an effusion.  Additional Studies: n/a  Assessment/Plan: Right ring finger abscess and cellulitis  Discussed with the patient that he has an abscess of the right ring finger and hand cellulitis.  Is being admitted to hospitalist for IV abx.  Discussed treatment options including observation and IV abx as well as abscess irrigation and debridement.  I think the only option at this time is I&D of the finger abscess.  We discussed the risks and likely post op plan and will proceed.  Post operatively, he will be on IV abx until cellulitis is resolved.  He will need  daily hydrotherapy with PT, specifically, he will need daily antibacterial soap soaks.  Wadie Lessen, MD Hand and Upper Extremity Surgery [facility] 08/10/2022 8:47 PM  Procedure Note: The patient confirmed the site, side and procedure.  A digital block was given with 10cc of 1% lidocaine.  After the block was set up, the hand and forearm were prepped with betadine.  Sterile drapes were applied and an 11 blade was used to make a 2cm incision  on the dorsal surface of the ring finger over the middle phalanx.  Immediate purulent material was encountered and cultures were obtained.  The incision was extended proximally above the PIP joint.  Further purulence was encountered and was able to be expressed manually.  A 1cm incision was made just distal to the MCP joint but no purulence was encountered.  The incisions were irrigated with 1.5L of normal saline and left open to drain.  Iodoform packing was placed in the primary incision.  The patient tolerated the procedure without complication.  Plan for daily antibacterial soap soaks and IV abx.

## 2022-08-10 NOTE — ED Provider Notes (Signed)
[facility]    CSN: [id number] Arrival date & time: 08/10/22  1458      History   Chief Complaint Chief Complaint  Patient presents with   Hand Injury    HPI Daniel Terry is a 37 y.o. male.   Patient here today for evaluation of significant swelling and pain to his right 4th finger that started after he was accidentally stuck by a cactus 4 days ago. He states that he took a pin and tried to drain the finger without improvement. He may have had low grade fever. He has noted a lump to his right axilla that has developed over the last day.   The history is provided by the patient.  Hand Injury Associated symptoms: fever     Past Medical History:  Diagnosis Date   Seizures [facility])     Patient Active Problem List   Diagnosis Date Noted   Cervical disc herniation    Hypokalemia    Laceration of right eyebrow 12/22/2020   Polysubstance abuse ([place]) 12/22/2020   Seizure disorder ([place]) 11/15/2020   Seizure ([place]) 11/21/2019    History reviewed. No pertinent surgical history.     Home Medications    Prior to Admission medications   Medication Sig Start Date End Date Taking? Authorizing Provider  Baclofen 5 MG TABS Take 1 tablet (5mg  total) by mouth 3 (three) times daily as needed for muscle spasms. 11/16/20   Harvie Heck, MD  folic acid (FOLVITE) 1 MG tablet Take 1 mg by mouth See admin instructions. Qd x 5 days    [provider]  levETIRAcetam (KEPPRA) 500 MG tablet Take 1 tablet (500 mg total) by mouth 2 (two) times daily. 11/16/20 12/16/20  Harvie Heck, MD  meclizine (ANTIVERT) 25 MG tablet Take 25 mg by mouth See admin instructions. Tid x 3 days as needed for nausea    [provider]  thiamine (VITAMIN B-1) 100 MG tablet Take 100 mg by mouth See admin instructions. Qd x 5 days    [provider]    Family History History reviewed. No pertinent family history.  Social History Social History   Tobacco Use   Smoking status:  Former   Smokeless tobacco: Never  Substance Use Topics   Alcohol use: No     Allergies   Diphenhydramine hcl   Review of Systems Review of Systems  Constitutional:  Positive for chills, diaphoresis and fever.  Eyes:  Negative for discharge and redness.  Respiratory:  Negative for shortness of breath.   Skin:  Positive for color change and wound.     Physical Exam Triage Vital Signs ED Triage Vitals [08/10/22 1528]  Enc Vitals Group     BP (!) 149/91     Pulse Rate (!) 114     Resp 16     Temp 99.3 F (37.4 C)     Temp Source Oral     SpO2 95 %     Weight      Height      Head Circumference      Peak Flow      Pain Score 10     Pain Loc      Pain Edu?      Excl. in [place]?    No data found.  Updated Vital Signs BP (!) 149/91 (BP Location: Left Arm)   Pulse (!) 114   Temp 99.3 F (37.4 C) (Oral)   Resp 16   SpO2 95%  Physical Exam Vitals and nursing note reviewed.  Constitutional:      Comments: Appears to be uncomfortable  HENT:     Nose: Nose normal.  Eyes:     Conjunctiva/sclera: Conjunctivae normal.  Cardiovascular:     Rate and Rhythm: Tachycardia present.  Pulmonary:     Effort: Pulmonary effort is normal. No respiratory distress.  Skin:    Comments: Significant swelling to right hand diffusely- worse to 4th finger with suspected collection of purulent fluid, leaking serosanguineous fluid.   Neurological:     Mental Status: He is alert.      UC Treatments / Results  Labs (all labs ordered are listed, but only abnormal results are displayed) Labs Reviewed - No data to display  EKG   Radiology No results found.  Procedures Procedures (including critical care time)  Medications Ordered in UC Medications - No data to display  Initial Impression / Assessment and Plan / UC Course  I have reviewed the triage vital signs and the nursing notes.  Pertinent labs & imaging results that were available during my care of the patient  were reviewed by me and considered in my medical decision making (see chart for details).    Recommended further evaluation in the ED. Patient is agreeable. Offered EMS transport, patient declines- will have someone transport via POV.   Final Clinical Impressions(s) / UC Diagnoses   Final diagnoses:  Cellulitis of right ring finger  Sepsis, due to unspecified organism, unspecified whether acute organ dysfunction present [facility])   Discharge Instructions   None    ED Prescriptions   None    PDMP not reviewed this encounter.   Francene Finders, PA-C 08/10/22 1547

## 2022-08-10 NOTE — H&P (Signed)
PCP:   Pcp, No   Chief Complaint:  Right finger injury  HPI: This is a 37 year old male with history of seizures, TBI, history of atrial fibrillation.  He presents with complaints of right fourth digit swelling and pain.  Approximately 5 days ago he attempted to catch a falling cactus of his dad's.  The thorns punctured his skin in the palm.  Per patient his right arm was swollen within 5 minutes.  The pain and swelling has continued, constant now.  The forefinger on his right hand is extremely swollen as is the rest of his hand.  He is unable to make a fist.  He decided to come to the ER.  In the ER imaging and exam suspicious for an abscess.  Hand surgeon consulted  Review of Systems:  The patient denies anorexia, fever, weight loss,, vision loss, decreased hearing, hoarseness, chest pain, syncope, dyspnea on exertion, peripheral edema, balance deficits, hemoptysis, abdominal pain, melena, hematochezia, severe indigestion/heartburn, hematuria, incontinence, genital sores, muscle weakness, suspicious skin lesions, transient blindness, difficulty walking, depression, unusual weight change, abnormal bleeding, enlarged lymph nodes, angioedema, and breast masses. Positives:   Past Medical History: Past Medical History:  Diagnosis Date   Seizures ([place])    No past surgical history on file.  Medications: Prior to Admission medications   Medication Sig Start Date End Date Taking? Authorizing Provider  Baclofen 5 MG TABS Take 1 tablet (5mg  total) by mouth 3 (three) times daily as needed for muscle spasms. 11/16/20   Harvie Heck, MD  folic acid (FOLVITE) 1 MG tablet Take 1 mg by mouth See admin instructions. Qd x 5 days    [provider]  levETIRAcetam (KEPPRA) 500 MG tablet Take 1 tablet (500 mg total) by mouth 2 (two) times daily. 11/16/20 12/16/20  Harvie Heck, MD  meclizine (ANTIVERT) 25 MG tablet Take 25 mg by mouth See admin instructions. Tid x 3 days as needed for nausea    [provider]  thiamine (VITAMIN B-1) 100 MG tablet Take 100 mg by mouth See admin instructions. Qd x 5 days    [provider]    Allergies:   Allergies  Allergen Reactions   Diphenhydramine Hcl     Other reaction(s): Lethargy (intolerance) Excessive drowsiness    Social History:  reports that he has quit smoking. He has never used smokeless tobacco. He reports that he does not drink alcohol. No history on file for drug use.  Family History: Hypertension  Physical Exam: Vitals:   08/10/22 1627 08/10/22 1633  BP: (!) 158/103   Pulse: (!) 105   Resp: 18   Temp: 97.8 F (36.6 C)   TempSrc: Oral   SpO2: 100%   Weight:  72.4 kg  Height:  5\' 6"  (1.676 m)    General:  Alert and oriented times three, well developed and nourished, no acute distress Eyes: PERRLA, pink conjunctiva, no scleral icterus ENT: Moist oral mucosa, neck supple, no thyromegaly Lungs: clear to ascultation, no wheeze, no crackles, no use of accessory muscles Cardiovascular: regular rate and rhythm, no regurgitation, no gallops, no murmurs. No carotid bruits, no JVD Abdomen: soft, positive BS, non-tender, non-distended, no organomegaly, not an acute abdomen GU: not examined Neuro: CN II - XII grossly intact, sensation intact Musculoskeletal: strength 5/5 all extremities, no clubbing, cyanosis or edema.  Fourth digit right hand very swollen, puffy, erythematous, some necrotic looking skin on top.  Numbness on the palms or side Skin: no rash, no subcutaneous crepitation,  no decubitus Psych: appropriate patient   Labs on Admission:  Recent Labs    08/10/22 1655  NA 130*  K 3.6  CL 95*  CO2 26  GLUCOSE 119*  BUN 8  CREATININE 0.78  CALCIUM 9.0   Recent Labs    08/10/22 1655  AST 20  ALT 27  ALKPHOS 97  BILITOT 0.5  PROT 8.0  ALBUMIN 3.8    Recent Labs    08/10/22 1655  WBC 25.1*  NEUTROABS 20.7*  HGB 14.0  HCT 41.1  MCV 90.7  PLT 399    Radiological Exams on  Admission: CT HAND RIGHT W CONTRAST  Result Date: 08/10/2022 CLINICAL DATA:  Soft tissue mass. Swelling and pain to the fourth finger. Stuck by cactus 4 days ago. EXAM: CT OF THE UPPER RIGHT EXTREMITY WITH CONTRAST TECHNIQUE: Multidetector CT imaging of the upper right extremity was performed according to the standard protocol following intravenous contrast administration. RADIATION DOSE REDUCTION: This exam was performed according to the departmental dose-optimization program which includes automated exposure control, adjustment of the mA and/or kV according to patient size and/or use of iterative reconstruction technique. CONTRAST:  164mL OMNIPAQUE IOHEXOL 300 MG/ML  SOLN COMPARISON:  Right hand x-ray same day FINDINGS: Bones/Joint/Cartilage No evidence for fracture, focal osseous lesion or cortical erosion. No dislocation. Healed fourth and fifth metacarpal fractures. Ligaments Suboptimally assessed by CT. Muscles and Tendons Within normal limits. Soft tissues There is fluid collection surrounding the dorsal aspect of the fourth finger at the level of the proximal interphalangeal joint. Fluid measures 2.3 x 0.9 x 4.0 cm in largest transverse, AP and craniocaudad dimensions respectively. There is no evidence for soft tissue gas. Is unclear whether this communicates with the joint space. No foreign body identified. There is diffuse subcutaneous edema and skin thickening of the fourth finger. There is likely joint effusion at the fourth metacarpophalangeal joint. This is best seen dorsally. IMPRESSION: 1. Fluid collection surrounding the dorsal aspect of the fourth finger at the level of the proximal interphalangeal joint worrisome for abscess. It is unclear whether this communicates with the joint space. 2. Diffuse subcutaneous edema and skin thickening of the fourth finger compatible with cellulitis. 3. Probable joint effusion at the fourth metacarpophalangeal joint. Septic arthritis not excluded. 4. No acute  osseous abnormality. Electronically Signed   By: Ronney Asters M.D.   On: 08/10/2022 19:26   DG Hand Complete Right  Result Date: 08/10/2022 CLINICAL DATA:  Cactus injury 4 days ago, hand swelling and drainage, fourth finger EXAM: RIGHT HAND - COMPLETE 3+ VIEW COMPARISON:  01/17/2014 FINDINGS: Suboptimal positioning, presumably the patient is unable to extend the fingers. Old healed boxer's fractures of the fourth and fifth metacarpals. No acute fracture or bony destructive findings. Substantial swelling of the fourth finger is appreciated. No obvious gas tracking in the soft tissues. IMPRESSION: 1. Substantial soft tissue swelling of the fourth finger. No gas tracking in the soft tissues. No radiographically visible foreign body. 2. Old healed Boxer's fractures of the fourth and fifth metacarpals. Electronically Signed   By: Van Clines M.D.   On: 08/10/2022 17:18    Assessment/Plan Present on Admission:  Cellulitis fourth finger, right hand -Admit to MedSurg -Keep n.p.o. for now, IV fluid hydration -Hand surgeon Dr. Carmelina Peal consulted. Wound I&Ded -Morphine 2 mg IV every 4 hours as needed -Zosyn, vanco pharmacy to dose. Itraconazole ordered -bacterial, AFB and fungal cultures ordered   Hyponatremia -IV fluid hydration, BMP in a.m.   Seizure -not  taken in 7 months.  Keppra not started -per patient he does not drive  Tobacco Use -nicotine patch  Elevated blood pressure -May be secondary to pain and infection -As needed hydralazine ordered for systolic blood pressure greater than 180   h/o Polysubstance abuse ([facility])  h/o cocaine abuse  h/o Alcohol abuse  h/o Atrial fibrillation without current medication  h/o TBI -UDS and alcohol level ordered  Thaila Bottoms 08/10/2022, 7:34 PM

## 2022-08-10 NOTE — ED Notes (Signed)
Patient is being discharged from the Urgent Care and sent to the Emergency Department via POV . Per Milus Mallick PA, patient is in need of higher level of care due to hand infection. Patient is aware and verbalizes understanding of plan of care.  Vitals:   08/10/22 1528  BP: (!) 149/91  Pulse: (!) 114  Resp: 16  Temp: 99.3 F (37.4 C)  SpO2: 95%

## 2022-08-10 NOTE — ED Notes (Signed)
Patient transported to CT 

## 2022-08-10 NOTE — Progress Notes (Addendum)
Pharmacy Antibiotic Note  Daniel Terry is a 37 y.o. male admitted on 08/10/2022 with  wound infection . Patient grabbed a cactus a few days prior to admission and is now having swelling of the hand; he tried to drain the finger with a pin but did not have any improvement. He notes lump to right axilla. Pharmacy has been consulted for Zosyn dosing.  Addendum 2130: Pharmacy consulted to add vancomycin.  Plan: -Zosyn 3.375 g IV q8h extended infusion -Vanc 1 g given earlier - start 1250 mg IV q12h -Continue to follow cultures and clinical progress for dose adjustments and de-escalation as indicated   Height: 5\' 6"  (167.6 cm) Weight: 72.4 kg (159 lb 9.8 oz) IBW/kg (Calculated) : 63.8  Temp (24hrs), Avg:98.6 F (37 C), Min:97.8 F (36.6 C), Max:99.3 F (37.4 C)  Recent Labs  Lab 08/10/22 1655  WBC 25.1*  CREATININE 0.78  LATICACIDVEN 0.9    Estimated Creatinine Clearance: 116.3 mL/min (by C-G formula based on SCr of 0.78 mg/dL).    Allergies  Allergen Reactions   Diphenhydramine Hcl     Other reaction(s): Lethargy (intolerance) Excessive drowsiness    Antimicrobials this admission: Zosyn 1/25 >> Vanc 1/25 >>  Dose adjustments this admission: NA  Microbiology results: 1/25 BCx: pending   Thank you for allowing pharmacy to be a part of this patient's care.  Tawnya Crook, PharmD, BCPS Clinical Pharmacist 08/10/2022 7:52 PM

## 2022-08-10 NOTE — ED Provider Notes (Signed)
[place] [facility] AT [facility] Provider Note   CSN: [id number] Arrival date & time: 08/10/22  1615     History  Chief Complaint  Patient presents with   Hand Pain    Daniel Terry is a 37 y.o. male here presenting with right hand pain and swelling.  Patient states that 5 days ago he accidentally knocked down a cactus with the right fourth finger.  He states that it was bleeding a little bit afterwards.  He noticed progressive swelling and pain and redness afterwards.  Patient adamantly denies any IV drug use.  Patient states that there is purulent discharge from the right fourth finger.  He states that he is unable to bend the finger.  The history is provided by the patient.       Home Medications Prior to Admission medications   Medication Sig Start Date End Date Taking? Authorizing Provider  Baclofen 5 MG TABS Take 1 tablet (5mg  total) by mouth 3 (three) times daily as needed for muscle spasms. 11/16/20   01/16/21, MD  folic acid (FOLVITE) 1 MG tablet Take 1 mg by mouth See admin instructions. Qd x 5 days    [provider]  levETIRAcetam (KEPPRA) 500 MG tablet Take 1 tablet (500 mg total) by mouth 2 (two) times daily. 11/16/20 12/16/20  02/15/21, MD  meclizine (ANTIVERT) 25 MG tablet Take 25 mg by mouth See admin instructions. Tid x 3 days as needed for nausea    [provider]  thiamine (VITAMIN B-1) 100 MG tablet Take 100 mg by mouth See admin instructions. Qd x 5 days    [provider]      Allergies    Diphenhydramine hcl    Review of Systems   Review of Systems  Musculoskeletal:        Right fourth finger swelling  All other systems reviewed and are negative.   Physical Exam Updated Vital Signs BP (!) 158/103 (BP Location: Left Arm)   Pulse (!) 105   Temp 97.8 F (36.6 C) (Oral)   Resp 18   Ht 5\' 6"  (1.676 m)   Wt 72.4 kg   SpO2 100%   BMI 25.76 kg/m  Physical Exam Vitals and nursing note  reviewed.  Constitutional:      Appearance: Normal appearance.     Comments: Uncomfortable  HENT:     Head: Normocephalic.     Nose: Nose normal.     Mouth/Throat:     Mouth: Mucous membranes are moist.  Eyes:     Extraocular Movements: Extraocular movements intact.     Pupils: Pupils are equal, round, and reactive to light.  Cardiovascular:     Rate and Rhythm: Normal rate and regular rhythm.     Pulses: Normal pulses.     Heart sounds: Normal heart sounds.  Pulmonary:     Effort: Pulmonary effort is normal.     Breath sounds: Normal breath sounds.  Abdominal:     General: Abdomen is flat.     Palpations: Abdomen is soft.  Musculoskeletal:     Cervical back: Normal range of motion and neck supple.     Comments: Right fourth finger with obvious sausage digit.  There appears to be some purulent drainage in the right proximal phalanx.  There is redness of the entire dorsum of the right hand.  The redness stretches to the wrist.  Skin:    General: Skin is warm.  Neurological:  General: No focal deficit present.     Mental Status: He is alert and oriented to person, place, and time.  Psychiatric:        Mood and Affect: Mood normal.        Behavior: Behavior normal.     ED Results / Procedures / Treatments   Labs (all labs ordered are listed, but only abnormal results are displayed) Labs Reviewed  COMPREHENSIVE METABOLIC PANEL - Abnormal; Notable for the following components:      Result Value   Sodium 130 (*)    Chloride 95 (*)    Glucose, Bld 119 (*)    All other components within normal limits  CBC WITH DIFFERENTIAL/PLATELET - Abnormal; Notable for the following components:   WBC 25.1 (*)    Neutro Abs 20.7 (*)    Monocytes Absolute 1.7 (*)    Abs Immature Granulocytes 0.14 (*)    All other components within normal limits  CULTURE, BLOOD (ROUTINE X 2)  CULTURE, BLOOD (ROUTINE X 2)  LACTIC ACID, PLASMA  LACTIC ACID, PLASMA  SEDIMENTATION RATE  C-REACTIVE  PROTEIN    EKG None  Radiology DG Hand Complete Right  Result Date: 08/10/2022 CLINICAL DATA:  Cactus injury 4 days ago, hand swelling and drainage, fourth finger EXAM: RIGHT HAND - COMPLETE 3+ VIEW COMPARISON:  01/17/2014 FINDINGS: Suboptimal positioning, presumably the patient is unable to extend the fingers. Old healed boxer's fractures of the fourth and fifth metacarpals. No acute fracture or bony destructive findings. Substantial swelling of the fourth finger is appreciated. No obvious gas tracking in the soft tissues. IMPRESSION: 1. Substantial soft tissue swelling of the fourth finger. No gas tracking in the soft tissues. No radiographically visible foreign body. 2. Old healed Boxer's fractures of the fourth and fifth metacarpals. Electronically Signed   By: Van Clines M.D.   On: 08/10/2022 17:18    Procedures Procedures    CRITICAL CARE Performed by: Wandra Arthurs   Total critical care time: 30 minutes  Critical care time was exclusive of separately billable procedures and treating other patients.  Critical care was necessary to treat or prevent imminent or life-threatening deterioration.  Critical care was time spent personally by me on the following activities: development of treatment plan with patient and/or surrogate as well as nursing, discussions with consultants, evaluation of patient's response to treatment, examination of patient, obtaining history from patient or surrogate, ordering and performing treatments and interventions, ordering and review of laboratory studies, ordering and review of radiographic studies, pulse oximetry and re-evaluation of patient's condition.   Medications Ordered in ED Medications  vancomycin (VANCOCIN) IVPB 1000 mg/200 mL premix (1,000 mg Intravenous New Bag/Given 08/10/22 1820)  piperacillin-tazobactam (ZOSYN) IVPB 3.375 g (3.375 g Intravenous New Bag/Given 08/10/22 1832)  morphine (PF) 2 MG/ML injection 2 mg (2 mg Intravenous Given  08/10/22 1656)  HYDROmorphone (DILAUDID) injection 1 mg (1 mg Intravenous Given 08/10/22 1816)    ED Course/ Medical Decision Making/ A&P                             Medical Decision Making Daniel Terry is a 37 y.o. male here presenting with right hand pain and swelling.  Patient has obvious sausage digit of the right fourth finger.  Patient is unable to flex the finger.  Concern for possible abscess of the finger.  Also concerned that he may be septic from it.  Will get x-rays and CBC  and CMP and lactate and cultures.  Will give IV antibiotics  6:49 PM White blood cell count is 25.  X-rays show no gas.  I discussed case with Dr. Ala Bent from hand surgery.  He requests CT of the hand and also ESR and CRP.  He will see the patient later tonight or tomorrow and recommend medicine service for IV abx.   9 pm Dr. Ala Bent from hand surgery saw patient and performed a bedside I&D.  He sent off cultures including fungal culture.  Problems Addressed: Abscess of finger, left: acute illness or injury Sepsis, due to unspecified organism, unspecified whether acute organ dysfunction present [facility]): acute illness or injury  Amount and/or Complexity of Data Reviewed Labs: ordered. Decision-making details documented in ED Course. Radiology: ordered and independent interpretation performed. Decision-making details documented in ED Course.  Risk Prescription drug management. Decision regarding hospitalization.    Final Clinical Impression(s) / ED Diagnoses Final diagnoses:  None    Rx / DC Orders ED Discharge Orders     None         Drenda Freeze, MD 08/10/22 2235

## 2022-08-11 DIAGNOSIS — L02511 Cutaneous abscess of right hand: Secondary | ICD-10-CM | POA: Diagnosis not present

## 2022-08-11 LAB — CBC WITH DIFFERENTIAL/PLATELET
Abs Immature Granulocytes: 0.08 10*3/uL — ABNORMAL HIGH (ref 0.00–0.07)
Basophils Absolute: 0.1 10*3/uL (ref 0.0–0.1)
Basophils Relative: 1 %
Eosinophils Absolute: 0.3 10*3/uL (ref 0.0–0.5)
Eosinophils Relative: 2 %
HCT: 37.8 % — ABNORMAL LOW (ref 39.0–52.0)
Hemoglobin: 12.6 g/dL — ABNORMAL LOW (ref 13.0–17.0)
Immature Granulocytes: 1 %
Lymphocytes Relative: 11 %
Lymphs Abs: 1.8 10*3/uL (ref 0.7–4.0)
MCH: 30.4 pg (ref 26.0–34.0)
MCHC: 33.3 g/dL (ref 30.0–36.0)
MCV: 91.3 fL (ref 80.0–100.0)
Monocytes Absolute: 1.4 10*3/uL — ABNORMAL HIGH (ref 0.1–1.0)
Monocytes Relative: 8 %
Neutro Abs: 13.3 10*3/uL — ABNORMAL HIGH (ref 1.7–7.7)
Neutrophils Relative %: 77 %
Platelets: 375 10*3/uL (ref 150–400)
RBC: 4.14 MIL/uL — ABNORMAL LOW (ref 4.22–5.81)
RDW: 12 % (ref 11.5–15.5)
WBC: 16.9 10*3/uL — ABNORMAL HIGH (ref 4.0–10.5)
nRBC: 0 % (ref 0.0–0.2)

## 2022-08-11 LAB — HIV ANTIBODY (ROUTINE TESTING W REFLEX): HIV Screen 4th Generation wRfx: NONREACTIVE

## 2022-08-11 LAB — BASIC METABOLIC PANEL
Anion gap: 10 (ref 5–15)
BUN: 8 mg/dL (ref 6–20)
CO2: 23 mmol/L (ref 22–32)
Calcium: 8.4 mg/dL — ABNORMAL LOW (ref 8.9–10.3)
Chloride: 99 mmol/L (ref 98–111)
Creatinine, Ser: 0.73 mg/dL (ref 0.61–1.24)
GFR, Estimated: >60 mL/min (ref >60)
Glucose, Bld: 138 mg/dL — ABNORMAL HIGH (ref 70–99)
Potassium: 3.9 mmol/L (ref 3.5–5.1)
Sodium: 132 mmol/L — ABNORMAL LOW (ref 135–145)

## 2022-08-11 LAB — C-REACTIVE PROTEIN: CRP: 19.4 mg/dL — ABNORMAL HIGH (ref <1.0)

## 2022-08-11 LAB — SEDIMENTATION RATE: Sed Rate: 35 mm/hr — ABNORMAL HIGH (ref 0–16)

## 2022-08-11 MED ORDER — SODIUM CHLORIDE 0.9 % IV SOLN: INTRAVENOUS | Status: DC

## 2022-08-11 MED ORDER — OXYCODONE HCL 5 MG PO TABS: 5.0000 mg | ORAL_TABLET | ORAL | Status: DC | PRN

## 2022-08-11 MED ORDER — OXYCODONE HCL 5 MG PO TABS
2.5000 mg | ORAL_TABLET | ORAL | Status: DC | PRN
  Administered 2022-08-11 – 2022-08-12 (×2): 2.5 mg via ORAL
  Filled 2022-08-11 (×2): qty 1

## 2022-08-11 NOTE — Progress Notes (Signed)
PROGRESS NOTE  [name]:[id number] DOB: [birth year] DOA: 08/10/2022 PCP: Merryl Hacker, No  Hospital Course/Subjective: 37 year old male with history of seizures, TBI, history of atrial fibrillation.  He presents with complaints of right fourth digit swelling and pain after being poked by a cactus about 5 days ago. He was admitted to the hospitalist service and seen by hand surgery who performed I&D in the ER at the time of admission. This morning he is having some pain in the hand, but is otherwise stable denies fevers, chills, nausea.   Assessment/Plan:   Cellulitis fourth finger, right hand -Admit to MedSurg -tolerating diet. Continue IVF as below -Hand surgeon Dr. Ala Bent consulted. Wound I&Ded in ER -Oxycodone 2.5mg  PO Q3 hours PRN (low dose as Itraconazole raises levels) as well as Morphine 2 mg IV every 4 hours as needed -Zosyn, [facility] pharmacy to dose. Itraconazole as well -bacterial, AFB and fungal cultures ordered    Hyponatremia -IV fluid hydration, BMP in a.m.    Seizure -not taken in 7 months.  Keppra not started -per patient he does not drive   Tobacco Use -nicotine patch   Elevated blood pressure -May be secondary to pain and infection -As needed hydralazine ordered for systolic blood pressure greater than 180    h/o Polysubstance abuse ([place])  h/o cocaine abuse  h/o Alcohol abuse  h/o Atrial fibrillation without current medication  h/o TBI -UDS and alcohol level ordered, note positive for benzo, amphetamines, THC, opiates  DVT Prophylaxis: Lovenox  Code Status: FULL   Family Communication: None present    Disposition Plan: Likely home in 2-3 days as cellulitis resolves.   Consultants: OrthoHand   Procedures: I&D in ER on 1/25   Antimicrobials: Anti-infectives (From admission, onward)    Start     Dose/Rate Route Frequency Ordered Stop   08/11/22 2300  itraconazole (SPORANOX) capsule 200 mg  Status:  Discontinued        200 mg Oral 2 times  daily after meals 08/10/22 2118 08/10/22 2342   08/11/22 0600  vancomycin (VANCOREADY) IVPB 1250 mg/250 mL        1,250 mg 166.7 mL/hr over 90 Minutes Intravenous Every 12 hours 08/10/22 2125     08/10/22 2345  itraconazole (SPORANOX) capsule 200 mg        200 mg Oral 2 times daily after meals 08/10/22 2342     08/10/22 2200  piperacillin-tazobactam (ZOSYN) IVPB 3.375 g        3.375 g 12.5 mL/hr over 240 Minutes Intravenous Every 8 hours 08/10/22 1947     08/10/22 1815  vancomycin (VANCOCIN) IVPB 1000 mg/200 mL premix        1,000 mg 200 mL/hr over 60 Minutes Intravenous  Once 08/10/22 1809 08/11/22 0310   08/10/22 1815  piperacillin-tazobactam (ZOSYN) IVPB 3.375 g        3.375 g 100 mL/hr over 30 Minutes Intravenous  Once 08/10/22 1809 08/11/22 0310       Objective: Vitals:   08/11/22 0300 08/11/22 0600 08/11/22 0719 08/11/22 0730  BP: (!) 143/91 (!) 136/91  122/87  Pulse: 88 81  80  Resp: 14 13  15   Temp:   98.3 F (36.8 C) 98.3 F (36.8 C)  TempSrc:    Oral  SpO2: 98% 96%  97%  Weight:      Height:       No intake or output data in the 24 hours ending 08/11/22 0849 Filed Weights   08/10/22 1633  Weight:  72.4 kg   Exam: General:  Alert, oriented, calm, in no acute distress, sitting up in bed Eyes: EOMI, clear sclerea Neck: supple, no masses, trachea mildline  Cardiovascular: RRR, no murmurs or rubs, no peripheral edema  Respiratory: clear to auscultation bilaterally, no wheezes, no crackles  Abdomen: soft, nontender, nondistended, normal bowel tones heard  Skin: dry, no rashes  Musculoskeletal: no joint effusions, normal range of motion, right hand wrapped. Visible fingers are warm to touch, with normal cap refill and sensorium intact Psychiatric: appropriate affect, normal speech  Neurologic: extraocular muscles intact, clear speech, moving all extremities with intact sensorium   Data Reviewed: CBC: Recent Labs  Lab 08/10/22 1655 08/10/22 2221  WBC 25.1*  22.4*  NEUTROABS 20.7*  --   HGB 14.0 13.2  HCT 41.1 38.8*  MCV 90.7 90.4  PLT 399 161   Basic Metabolic Panel: Recent Labs  Lab 08/10/22 1655 08/10/22 2221  NA 130*  --   K 3.6  --   CL 95*  --   CO2 26  --   GLUCOSE 119*  --   BUN 8  --   CREATININE 0.78 0.80  CALCIUM 9.0  --    GFR: Estimated Creatinine Clearance: 116.3 mL/min (by C-G formula based on SCr of 0.8 mg/dL). Liver Function Tests: Recent Labs  Lab 08/10/22 1655  AST 20  ALT 27  ALKPHOS 97  BILITOT 0.5  PROT 8.0  ALBUMIN 3.8   No results for input(s): "LIPASE", "AMYLASE" in the last 168 hours. No results for input(s): "AMMONIA" in the last 168 hours. Coagulation Profile: No results for input(s): "INR", "PROTIME" in the last 168 hours. Cardiac Enzymes: No results for input(s): "CKTOTAL", "CKMB", "CKMBINDEX", "TROPONINI" in the last 168 hours. BNP (last 3 results) No results for input(s): "PROBNP" in the last 8760 hours. HbA1C: No results for input(s): "HGBA1C" in the last 72 hours. CBG: No results for input(s): "GLUCAP" in the last 168 hours. Lipid Profile: No results for input(s): "CHOL", "HDL", "LDLCALC", "TRIG", "CHOLHDL", "LDLDIRECT" in the last 72 hours. Thyroid Function Tests: No results for input(s): "TSH", "T4TOTAL", "FREET4", "T3FREE", "THYROIDAB" in the last 72 hours. Anemia Panel: No results for input(s): "VITAMINB12", "FOLATE", "FERRITIN", "TIBC", "IRON", "RETICCTPCT" in the last 72 hours. Urine analysis:    Component Value Date/Time   COLORURINE YELLOW 11/15/2020 1750   APPEARANCEUR CLEAR 11/15/2020 1750   LABSPEC 1.013 11/15/2020 1750   PHURINE 5.0 11/15/2020 1750   GLUCOSEU 50 (A) 11/15/2020 1750   HGBUR MODERATE (A) 11/15/2020 1750   BILIRUBINUR NEGATIVE 11/15/2020 1750   KETONESUR NEGATIVE 11/15/2020 1750   PROTEINUR 30 (A) 11/15/2020 1750   UROBILINOGEN 0.2 03/25/2010 2223   NITRITE NEGATIVE 11/15/2020 1750   LEUKOCYTESUR NEGATIVE 11/15/2020 1750   Sepsis  Labs: @LABRCNTIP (procalcitonin:4,lacticidven:4)  )No results found for this or any previous visit (from the past 240 hour(s)).   Studies: CT HAND RIGHT W CONTRAST  Result Date: 08/10/2022 CLINICAL DATA:  Soft tissue mass. Swelling and pain to the fourth finger. Stuck by cactus 4 days ago. EXAM: CT OF THE UPPER RIGHT EXTREMITY WITH CONTRAST TECHNIQUE: Multidetector CT imaging of the upper right extremity was performed according to the standard protocol following intravenous contrast administration. RADIATION DOSE REDUCTION: This exam was performed according to the departmental dose-optimization program which includes automated exposure control, adjustment of the mA and/or kV according to patient size and/or use of iterative reconstruction technique. CONTRAST:  140mL OMNIPAQUE IOHEXOL 300 MG/ML  SOLN COMPARISON:  Right hand x-ray same day  FINDINGS: Bones/Joint/Cartilage No evidence for fracture, focal osseous lesion or cortical erosion. No dislocation. Healed fourth and fifth metacarpal fractures. Ligaments Suboptimally assessed by CT. Muscles and Tendons Within normal limits. Soft tissues There is fluid collection surrounding the dorsal aspect of the fourth finger at the level of the proximal interphalangeal joint. Fluid measures 2.3 x 0.9 x 4.0 cm in largest transverse, AP and craniocaudad dimensions respectively. There is no evidence for soft tissue gas. Is unclear whether this communicates with the joint space. No foreign body identified. There is diffuse subcutaneous edema and skin thickening of the fourth finger. There is likely joint effusion at the fourth metacarpophalangeal joint. This is best seen dorsally. IMPRESSION: 1. Fluid collection surrounding the dorsal aspect of the fourth finger at the level of the proximal interphalangeal joint worrisome for abscess. It is unclear whether this communicates with the joint space. 2. Diffuse subcutaneous edema and skin thickening of the fourth finger  compatible with cellulitis. 3. Probable joint effusion at the fourth metacarpophalangeal joint. Septic arthritis not excluded. 4. No acute osseous abnormality. Electronically Signed   By: Darliss Cheney M.D.   On: 08/10/2022 19:26   DG Hand Complete Right  Result Date: 08/10/2022 CLINICAL DATA:  Cactus injury 4 days ago, hand swelling and drainage, fourth finger EXAM: RIGHT HAND - COMPLETE 3+ VIEW COMPARISON:  01/17/2014 FINDINGS: Suboptimal positioning, presumably the patient is unable to extend the fingers. Old healed boxer's fractures of the fourth and fifth metacarpals. No acute fracture or bony destructive findings. Substantial swelling of the fourth finger is appreciated. No obvious gas tracking in the soft tissues. IMPRESSION: 1. Substantial soft tissue swelling of the fourth finger. No gas tracking in the soft tissues. No radiographically visible foreign body. 2. Old healed Boxer's fractures of the fourth and fifth metacarpals. Electronically Signed   By: Gaylyn Rong M.D.   On: 08/10/2022 17:18    Scheduled Meds:  enoxaparin (LOVENOX) injection  40 mg Subcutaneous Q24H   itraconazole  200 mg Oral BID PC   melatonin  5 mg Oral QHS   nicotine  14 mg Transdermal Daily    Continuous Infusions:  piperacillin-tazobactam (ZOSYN)  IV Stopped (08/11/22 4627)   vancomycin Stopped (08/11/22 0832)     LOS: 1 day   Time spent: 31 minutes  Kyiesha Millward Vergie Living, MD [facility] Pager [phone]  If 7PM-7AM, please contact night-coverage [email or web address] Password [facility] 08/11/2022, 8:49 AM

## 2022-08-11 NOTE — ED Notes (Signed)
Dressing changed.

## 2022-08-11 NOTE — Progress Notes (Signed)
PT Note  Patient Details Name: Daniel Terry MRN: [id number] DOB: 04-08-1986     Order received for PT hydrotherapy for this patient's finger. Hydrotherapy is to address necrotic wounds and debridement, at this time there is only 2 small cm incision for drainage and order for daily antibacterial soap hand soaks. This is not a skilled service that PT hydrotherapy needs to be involved in. I spoke with the bedside nurse [facility] to make sure she is aware nursing will need to complete these daily soaks for the patient.   Thank you    Clide Dales 08/11/2022, 5:16 PM

## 2022-08-11 NOTE — TOC Progression Note (Signed)
Transition of Care [facility]) - Progression Note    Patient Details  Name: HILLARY SCHWEGLER MRN: [id number] Date of Birth: May 01, 1986  Transition of Care [facility]) CM/SW [employer], RN Phone Number:[phone]  08/11/2022, 9:04 PM  Clinical Narrative:     Transition of Care [facility]) Screening Note   Patient Details  Name: AARUSH STUKEY Date of Birth: 09-23-1985   Transition of Care [facility]) CM/SW Contact:    Angelita Ingles, RN Phone Number: 08/11/2022, 9:04 PM    Transition of Care Department [facility]) has reviewed patient and no TOC needs have been identified at this time. We will continue to monitor patient advancement through interdisciplinary progression rounds. If new patient transition needs arise, please place a TOC consult.          Expected Discharge Plan and Services                                               Social Determinants of Health (SDOH) Interventions SDOH Screenings   Tobacco Use: Medium Risk (08/10/2022)    Readmission Risk Interventions     No data to display

## 2022-08-12 MED ORDER — CHLORHEXIDINE GLUCONATE 4 % EX LIQD
Freq: Every day | CUTANEOUS | Status: DC
  Filled 2022-08-12 (×4): qty 15

## 2022-08-12 NOTE — Progress Notes (Addendum)
PROGRESS NOTE  Daniel Terry [id number] DOB: 1985-09-13 DOA: 08/10/2022 PCP: Merryl Hacker, No  Hospital Course/Subjective: 37 year old male with history of seizures, TBI, history of atrial fibrillation.  He presents with complaints of right fourth digit swelling and pain after being poked by a cactus about 5 days ago prior to presentation. He was admitted to the hospitalist service and seen by hand surgery who performed I&D in the ER at the time of admission.   08/12/2022: Patient seen.  Significant drainage from the wound is noted.  Wound distraught.  No fever or chills.  Assessment/Plan:   Cellulitis fourth finger, right hand: -Continue care as per hand surgery team. -Continue antibiotics and antifungal (oral itraconazole twice daily, vancomycin and Zosyn). -Wound care consult. -Hand surgeon Dr. Ala Bent consulted.   -Optimize pain control. -Local wound care. -Oxycodone 2.5mg  PO Q3 hours PRN     Hyponatremia -Sodium is 132 today (up from 130).      Seizure   Tobacco Use -nicotine patch   Elevated blood pressure -Continue to optimize. -Goal blood pressure should be less than 130/80 mmHg.     h/o Polysubstance abuse (HCC)  h/o cocaine abuse  h/o Alcohol abuse  h/o Atrial fibrillation without current medication  h/o TBI -UDS and alcohol level ordered, note positive for benzo, amphetamines, THC, opiates  DVT Prophylaxis: Lovenox  Code Status: FULL   Family Communication: None present    Disposition Plan: Likely home in 2-3 days as cellulitis resolves.   Consultants: OrthoHand   Procedures: I&D in ER on 1/25   Antimicrobials: Anti-infectives (From admission, onward)    Start     Dose/Rate Route Frequency Ordered Stop   08/11/22 2300  itraconazole (SPORANOX) capsule 200 mg  Status:  Discontinued        200 mg Oral 2 times daily after meals 08/10/22 2118 08/10/22 2342   08/11/22 0600  vancomycin (VANCOREADY) IVPB 1250 mg/250 mL        1,250 mg 166.7 mL/hr over 90  Minutes Intravenous Every 12 hours 08/10/22 2125     08/10/22 2345  itraconazole (SPORANOX) capsule 200 mg        200 mg Oral 2 times daily after meals 08/10/22 2342     08/10/22 2200  piperacillin-tazobactam (ZOSYN) IVPB 3.375 g        3.375 g 12.5 mL/hr over 240 Minutes Intravenous Every 8 hours 08/10/22 1947     08/10/22 1815  vancomycin (VANCOCIN) IVPB 1000 mg/200 mL premix        1,000 mg 200 mL/hr over 60 Minutes Intravenous  Once 08/10/22 1809 08/11/22 0310   08/10/22 1815  piperacillin-tazobactam (ZOSYN) IVPB 3.375 g        3.375 g 100 mL/hr over 30 Minutes Intravenous  Once 08/10/22 1809 08/11/22 0310       Objective: Vitals:   08/11/22 2129 08/12/22 0018 08/12/22 0551 08/12/22 1018  BP: (!) 119/104 (!) 145/99 (!) 141/109 (!) 135/91  Pulse: 91 87 81 82  Resp: 20 20 18 16   Temp: 98.1 F (36.7 C) 98.4 F (36.9 C) 98.1 F (36.7 C) 97.7 F (36.5 C)  TempSrc: Oral Oral Oral Oral  SpO2: 99% 100% 100% 100%  Weight:      Height:        Intake/Output Summary (Last 24 hours) at 08/12/2022 1251 Last data filed at 08/12/2022 1100 Gross per 24 hour  Intake 831.36 ml  Output --  Net 831.36 ml   Filed Weights   08/10/22 1633  Weight: 72.4 kg   Exam: General: Not in any distress.  Awake and alert. HEENT: No pallor.  No jaundice. Neck: Supple. Lungs: Clear to auscultation. CVS: S1-S2. Abdomen: Soft and nontender. Extremities: Right hand is wrapped consults.  No edema of lower extremities.    Data Reviewed: CBC: Recent Labs  Lab 08/10/22 1655 08/10/22 2221 08/11/22 1223  WBC 25.1* 22.4* 16.9*  NEUTROABS 20.7*  --  13.3*  HGB 14.0 13.2 12.6*  HCT 41.1 38.8* 37.8*  MCV 90.7 90.4 91.3  PLT 399 [id number]    Basic Metabolic Panel: Recent Labs  Lab 08/10/22 1655 08/10/22 2221 08/11/22 1223  NA 130*  --  132*  K 3.6  --  3.9  CL 95*  --  99  CO2 26  --  23  GLUCOSE 119*  --  138*  BUN 8  --  8  CREATININE 0.78 0.80 0.73  CALCIUM 9.0  --  8.4*     GFR: Estimated Creatinine Clearance: 116.3 mL/min (by C-G formula based on SCr of 0.73 mg/dL). Liver Function Tests: Recent Labs  Lab 08/10/22 1655  AST 20  ALT 27  ALKPHOS 97  BILITOT 0.5  PROT 8.0  ALBUMIN 3.8    No results for input(s): "LIPASE", "AMYLASE" in the last 168 hours. No results for input(s): "AMMONIA" in the last 168 hours. Coagulation Profile: No results for input(s): "INR", "PROTIME" in the last 168 hours. Cardiac Enzymes: No results for input(s): "CKTOTAL", "CKMB", "CKMBINDEX", "TROPONINI" in the last 168 hours. BNP (last 3 results) No results for input(s): "PROBNP" in the last 8760 hours. HbA1C: No results for input(s): "HGBA1C" in the last 72 hours. CBG: No results for input(s): "GLUCAP" in the last 168 hours. Lipid Profile: No results for input(s): "CHOL", "HDL", "LDLCALC", "TRIG", "CHOLHDL", "LDLDIRECT" in the last 72 hours. Thyroid Function Tests: No results for input(s): "TSH", "T4TOTAL", "FREET4", "T3FREE", "THYROIDAB" in the last 72 hours. Anemia Panel: No results for input(s): "VITAMINB12", "FOLATE", "FERRITIN", "TIBC", "IRON", "RETICCTPCT" in the last 72 hours. Urine analysis:    Component Value Date/Time   COLORURINE YELLOW 11/15/2020 1750   APPEARANCEUR CLEAR 11/15/2020 1750   LABSPEC 1.013 11/15/2020 1750   PHURINE 5.0 11/15/2020 1750   GLUCOSEU 50 (A) 11/15/2020 1750   HGBUR MODERATE (A) 11/15/2020 1750   BILIRUBINUR NEGATIVE 11/15/2020 1750   KETONESUR NEGATIVE 11/15/2020 1750   PROTEINUR 30 (A) 11/15/2020 1750   UROBILINOGEN 0.2 03/25/2010 2223   NITRITE NEGATIVE 11/15/2020 1750   LEUKOCYTESUR NEGATIVE 11/15/2020 1750   Sepsis Labs: @LABRCNTIP (procalcitonin:4,lacticidven:4)  ) Recent Results (from the past 240 hour(s))  Blood culture (routine x 2)     Status: None (Preliminary result)   Collection Time: 08/10/22  6:18 PM   Specimen: BLOOD  Result Value Ref Range Status   Specimen Description   Final    BLOOD LEFT  ANTECUBITAL Performed at [facility], [place] [address] [place], [place] [address]    Special Requests   Final    BOTTLES DRAWN AEROBIC AND ANAEROBIC Blood Culture adequate volume Performed at [facility] [address] [place], [place] [address]    Culture   Final    NO GROWTH 2 DAYS Performed at [facility] [address] [place], [place] [address]    Report Status PENDING  Incomplete  Blood culture (routine x 2)     Status: None (Preliminary result)   Collection Time: 08/10/22  6:30 PM   Specimen: BLOOD  Result  Value Ref Range Status   Specimen Description   Final    BLOOD RIGHT ANTECUBITAL Performed at [facility] [address], [place], [place] [address]    Special Requests   Final    BOTTLES DRAWN AEROBIC AND ANAEROBIC Blood Culture adequate volume Performed at [facility] [address], [place], [place] [address]    Culture   Final    NO GROWTH 2 DAYS Performed at [facility] [address] [place], [place] [address]    Report Status PENDING  Incomplete     Studies: No results found.  Scheduled Meds:  enoxaparin (LOVENOX) injection  40 mg Subcutaneous Q24H   itraconazole  200 mg Oral BID PC   melatonin  5 mg Oral QHS   nicotine  14 mg Transdermal Daily    Continuous Infusions:  sodium chloride 50 mL/hr at 08/11/22 1237   piperacillin-tazobactam (ZOSYN)  IV 3.375 g (08/12/22 0514)   vancomycin 1,250 mg (08/12/22 0516)     LOS: 2 days   Time spent: 35 minutes  Bonnell Public, MD [facility]  If 7PM-7AM, please contact night-coverage [email or web address] Password [facility] 08/12/2022, 12:51 PM

## 2022-08-13 DIAGNOSIS — L03011 Cellulitis of right finger: Secondary | ICD-10-CM | POA: Diagnosis not present

## 2022-08-13 DIAGNOSIS — E871 Hypo-osmolality and hyponatremia: Secondary | ICD-10-CM | POA: Diagnosis not present

## 2022-08-13 LAB — C-REACTIVE PROTEIN: CRP: 10.3 mg/dL — ABNORMAL HIGH (ref <1.0)

## 2022-08-13 MED ORDER — ACETAMINOPHEN 500 MG PO TABS
1000.0000 mg | ORAL_TABLET | Freq: Three times a day (TID) | ORAL | Status: DC
  Administered 2022-08-13 – 2022-08-15 (×6): 1000 mg via ORAL
  Filled 2022-08-13 (×6): qty 2

## 2022-08-13 MED ORDER — OXYCODONE HCL 5 MG PO TABS: 5.0000 mg | ORAL_TABLET | ORAL | Status: DC | PRN

## 2022-08-13 NOTE — Hospital Course (Signed)
Mr. Olesen is a 37 y.o. M with hx TBI, prior substance use now sober, and seizures, none in >6 months who presented with right ring finger cellulitis from a cactus.

## 2022-08-13 NOTE — Progress Notes (Signed)
Ortho Hand Progress Note  S: s/p bedside I&D of dorsal right ring finger abscess on 1/25.  Doing well at this time.  On vanc and zosyn as well as itraconazole.  Per PT note, they do not perform antibacterial soap soaks so the nurses have been performing them once a day.  The packing was not initially removed with PT and was not changed until today.  Patient feels that his finger is doing much better with the packing changed. Apparently, the lab did not process the bacterial cultures.  Fungal and AFB cultures are neg to date.   O:  last CRP 10.3 which is decreased. Last WBC 16.9 which is also decreased from prior.    Right ring finger swollen and erythematous except over distal phalanx.  Packing in place (just changed).  No obvious purulence present.  Some pain/tightness wrapping around from dorsal finger to volar aspect at base of finger.  Nontender along ring finger flexor tendon sheath except in focal area right where swelling and erythematous at base.  Able to flex and extend at MCP joint approx 45 deg without pain.      A/P: Right ring finger cellulitis and abscess s/p I&D on 1/25 after getting stuck by a cactus previously.  -Patient reports he did not have any injury to the hand before the cactus injury which is a little surprising then that his ESR was slightly elevated as this would normally take 2-3 weeks to rise.  Please obtain q48h CRP and CBC to monitor infection.  If labs do not improve over the next few days, may require debridement in the OR, this was discussed with the patient.   -Continue abx per primary team. Recommend ID consult for abx recommendations and outpatient follow up especially given the unusual mechanism.  -Please perform daily antibacterial soap soaks in warm water for 15 minutes each time.  Please change packing at this time so packing is removed prior to the soak and then a new one is placed afterward, making sure not to pack too tight that would prevent the wound to  fill in.  -Please reach out to my office at [phone] with any questions or concerns.   Wadie Lessen, MD Hand & Upper Extremity Surgery [facility] [place]

## 2022-08-13 NOTE — Progress Notes (Signed)
Pharmacy Antibiotic Note  Daniel Terry is a 37 y.o. male admitted on 08/10/2022 with  wound infection . Patient grabbed a cactus a few days prior to admission and is now having swelling of the hand; he tried to drain the finger with a pin but did not have any improvement. He notes lump to right axilla. Pharmacy has been consulted for Zosyn and vancomycin dosing.  Day 3 Vanc, Zosyn, itraconazole Afebrile WBC improving No abscess culture  Plan: Continue Zosyn 3.375 g IV q8h extended infusion Continue Vanc 1250 mg IV q12h Continue to follow cultures and clinical progress for dose adjustments and de-escalation as indicated Note patient taking PO meds, what is plan to switch antibiotics to PO? Doxy/augmentin?   Height: 5\' 6"  (167.6 cm) Weight: 72.4 kg (159 lb 9.8 oz) IBW/kg (Calculated) : 63.8  Temp (24hrs), Avg:98.1 F (36.7 C), Min:97.7 F (36.5 C), Max:98.6 F (37 C)  Recent Labs  Lab 08/10/22 1655 08/10/22 1815 08/10/22 2221 08/11/22 1223  WBC 25.1*  --  22.4* 16.9*  CREATININE 0.78  --  0.80 0.73  LATICACIDVEN 0.9 1.5  --   --      Estimated Creatinine Clearance: 116.3 mL/min (by C-G formula based on SCr of 0.73 mg/dL).    Allergies  Allergen Reactions   Diphenhydramine Hcl     Other reaction(s): Lethargy (intolerance) Excessive drowsiness    Antimicrobials this admission: Zosyn 1/25 >> Vanc 1/25 >>  Dose adjustments this admission: NA  Microbiology results: 1/25 BCx: ngtd   Thank you for allowing pharmacy to be a part of this patient's care.  Adrian Saran, PharmD, BCPS Secure Chat if ?s 08/13/2022 7:34 AM

## 2022-08-13 NOTE — Assessment & Plan Note (Signed)
Patient had frequent seizures when he was drinking.  He stopped drinking sometime in 2023, and seizures got better, so in Sep 2023 he stopped Keppra.    Since then, he's had one seizure he thinks  Today, had an aura, called out to desk for water because he thought he was going to have a seizure.  By the time nursing got to room, he was somewhat disoriented in his answers, didn't remember calling out, seemed dazed.   - Restart Keppra 500 BID

## 2022-08-13 NOTE — Assessment & Plan Note (Signed)
- 

## 2022-08-13 NOTE — Assessment & Plan Note (Signed)
I&D'd by Dr. Ala Bent at the bedside in the ER.  Aerobic culture sent but unfortunately not processed. Fungal and AFB cultures NGTD.  Overall improving, CRP 19 > 10 - COntinue vancomycin and Zosyn and itraconazole for now - COnsult ID - Daily antibacterial soap bath and replace packing daily - Trend CBC and CRP

## 2022-08-13 NOTE — Progress Notes (Signed)
  Progress Note   Patient: Daniel Terry [id number] DOB: Aug 03, 1985 DOA: 08/10/2022     3 DOS: the patient was seen and examined on 08/13/2022        Brief hospital course: [name] is a 37 y.o. M with hx TBI, prior substance use now sober, and seizures, none in >6 months who presented with right ring finger cellulitis from a cactus.     Assessment and Plan: * Right ring finger abscess and cellulitis I&D'd by Dr. Ala Bent at the bedside in the ER.  Aerobic culture sent but unfortunately not processed. Fungal and AFB cultures NGTD.  Overall improving, CRP 19 > 10 - COntinue vancomycin and Zosyn and itraconazole for now - COnsult ID - Daily antibacterial soap bath and replace packing daily - Trend CBC and CRP    TBI (traumatic brain injury) ([place])    H/O alcohol abuse Denies current use  H/O atrial fibrillation without current medication    Seizure disorder (HCC) No longer on Keppra, no seizures>6 mo  Hyponatremia Na 132 on admission, no symptoms - Recheck BMP        Subjective: Feeling well, slightly better than yesterday, still a lot of redness and pain and swelling.  There was a delay in wound care, no soaks until yesteray, and no changing the packing until today.     Physical Exam: BP (!) 136/93 (BP Location: Left Arm)   Pulse 65   Temp 97.6 F (36.4 C) (Oral)   Resp 16   Ht 5\' 6"  (1.676 m)   Wt 72.4 kg   SpO2 100%   BMI 25.76 kg/m   Adult male, sitting up in bed, interactive and appropriate RRR, no murmurs, no peripheral edema Respiratory rate normal, lungs clear without rales or wheezes   Data Reviewed: Discussed with hand surgery, Dr. Wilder Glade CRP down to 10.3 last night Sodium 132 yesterday, renal function normal White blood cell count improving yesterday  Family Communication: None present    Disposition: Status is: Inpatient Patient requires ongoing IV antibiotics for this hand threatening  infection        Author: Edwin Dada, MD 08/13/2022 3:19 PM  For on call review [email or web address].

## 2022-08-14 DIAGNOSIS — L02512 Cutaneous abscess of left hand: Secondary | ICD-10-CM | POA: Diagnosis not present

## 2022-08-14 DIAGNOSIS — L03011 Cellulitis of right finger: Secondary | ICD-10-CM | POA: Diagnosis not present

## 2022-08-14 LAB — CBC
HCT: 44.3 % (ref 39.0–52.0)
Hemoglobin: 14.8 g/dL (ref 13.0–17.0)
MCH: 30.1 pg (ref 26.0–34.0)
MCHC: 33.4 g/dL (ref 30.0–36.0)
MCV: 90.2 fL (ref 80.0–100.0)
Platelets: 391 10*3/uL (ref 150–400)
RBC: 4.91 MIL/uL (ref 4.22–5.81)
RDW: 11.7 % (ref 11.5–15.5)
WBC: 9.9 10*3/uL (ref 4.0–10.5)
nRBC: 0 % (ref 0.0–0.2)

## 2022-08-14 LAB — BASIC METABOLIC PANEL
Anion gap: 12 (ref 5–15)
BUN: 19 mg/dL (ref 6–20)
CO2: 22 mmol/L (ref 22–32)
Calcium: 9.1 mg/dL (ref 8.9–10.3)
Chloride: 98 mmol/L (ref 98–111)
Creatinine, Ser: 1.1 mg/dL (ref 0.61–1.24)
GFR, Estimated: >60 mL/min (ref >60)
Glucose, Bld: 94 mg/dL (ref 70–99)
Potassium: 3.9 mmol/L (ref 3.5–5.1)
Sodium: 132 mmol/L — ABNORMAL LOW (ref 135–145)

## 2022-08-14 LAB — C-REACTIVE PROTEIN: CRP: 4.6 mg/dL — ABNORMAL HIGH (ref <1.0)

## 2022-08-14 MED ORDER — VANCOMYCIN HCL IN DEXTROSE 1-5 GM/200ML-% IV SOLN
1000.0000 mg | Freq: Two times a day (BID) | INTRAVENOUS | Status: DC
  Administered 2022-08-14 – 2022-08-15 (×2): 1000 mg via INTRAVENOUS
  Filled 2022-08-14 (×2): qty 200

## 2022-08-14 MED ORDER — COLLAGENASE 250 UNIT/GM EX OINT
TOPICAL_OINTMENT | Freq: Every day | CUTANEOUS | Status: DC
  Filled 2022-08-14: qty 30

## 2022-08-14 MED ORDER — LEVETIRACETAM 500 MG PO TABS
500.0000 mg | ORAL_TABLET | Freq: Two times a day (BID) | ORAL | Status: DC
  Administered 2022-08-14 – 2022-08-15 (×3): 500 mg via ORAL
  Filled 2022-08-14 (×3): qty 1

## 2022-08-14 NOTE — Progress Notes (Signed)
Pharmacy Antibiotic Note  Daniel Terry is a 37 y.o. male admitted on 08/10/2022 with  wound infection . Patient grabbed a cactus a few days prior to admission and is now having swelling of the hand; he tried to drain the finger with a pin but did not have any improvement. He notes lump to right axilla. Pharmacy has been consulted for Zosyn and Vancomycin dosing.  Day 4 Vancomycin, Zosyn, Itraconazole Afebrile WBC improved to WNL Abscess culture sent but not processed SCr rising   Plan: Continue Zosyn 3.375 g IV q8h extended infusion Adjust Vancomycin to 1g IV q12h due to rise in SCr Itraconazole per MD Daily SCr while on Zosyn/Vancomycin combination F/u ID recommendations for de-escalation/duration of therapy    Height: 5\' 6"  (167.6 cm) Weight: 72.4 kg (159 lb 9.8 oz) IBW/kg (Calculated) : 63.8  Temp (24hrs), Avg:97.8 F (36.6 C), Min:97.4 F (36.3 C), Max:98.2 F (36.8 C)  Recent Labs  Lab 08/10/22 1655 08/10/22 1815 08/10/22 2221 08/11/22 1223 08/14/22 0524  WBC 25.1*  --  22.4* 16.9* 9.9  CREATININE 0.78  --  0.80 0.73 1.10  LATICACIDVEN 0.9 1.5  --   --   --      Estimated Creatinine Clearance: 84.6 mL/min (by C-G formula based on SCr of 1.1 mg/dL).    Allergies  Allergen Reactions   Diphenhydramine Hcl     Other reaction(s): Lethargy (intolerance) Excessive drowsiness    Antimicrobials this admission: 1/25 Zosyn >> 1/25 Vancomycin >> 1/25 Itraconazole >>  Microbiology results: 1/25 BCx: ngtd   Thank you for allowing pharmacy to be a part of this patient's care.  Lindell Spar, PharmD, BCPS Clinical Pharmacist 08/14/2022 2:09 PM

## 2022-08-14 NOTE — Consult Note (Signed)
[facility] for Infectious Disease  Total days of antibiotics 4               Reason for Consult: soft skin tissue infection    Referring Physician: danford  Principal Problem:   Right ring finger abscess and cellulitis Active Problems:   Seizure disorder ([place])   H/O atrial fibrillation without current medication   H/O alcohol abuse   TBI (traumatic brain injury) ([place])    HPI: Daniel Terry is a 37 y.o. male with history of seizure disorder, tobacco use was admitted on 1/25 for worsening pain, swelling, redness and dusky appearance about the joint. this started after injury to right hand (ring finger) that started to affect arm/ associated axillary LN tenderness. He reported subjective chills. Injury sustained 5 days prior to admit when he attempted to catch a falling cactus. He underwent I x D at bedside in the ED. He was started on vanco/piptazo/itraconazole. Cultures were only sent of fungal cx. Swelling has improved. Had better pain control yesterday but overall improved. Erythema and swelling of hand reduced  Past Medical History:  Diagnosis Date   Seizures ([place])     Allergies:  Allergies  Allergen Reactions   Diphenhydramine Hcl     Other reaction(s): Lethargy (intolerance) Excessive drowsiness    Current antibiotics:   MEDICATIONS:  acetaminophen  1,000 mg Oral TID   chlorhexidine   Topical Daily   collagenase   Topical Daily   enoxaparin (LOVENOX) injection  40 mg Subcutaneous Q24H   itraconazole  200 mg Oral BID PC   levETIRAcetam  500 mg Oral BID   melatonin  5 mg Oral QHS   nicotine  14 mg Transdermal Daily    Social History   Tobacco Use   Smoking status: Former   Smokeless tobacco: Never  Substance Use Topics   Alcohol use: No    No family history on file.  Review of Systems -  Hand injury. Overall 12 point ros is negative  OBJECTIVE: Temp:  [97.4 F (36.3 C)-97.6 F (36.4 C)] 97.6 F (36.4 C) (01/29 1428) Pulse Rate:  [71-90] 90  (01/29 1428) Resp:  [18] 18 (01/29 1428) BP: (129-139)/(88-93) 139/88 (01/29 1428) SpO2:  [100 %] 100 % (01/29 1428) Physical Exam  Constitutional: He is oriented to person, place, and time. He appears well-developed and well-nourished. No distress.  HENT:  Mouth/Throat: Oropharynx is clear and moist. No oropharyngeal exudate.  Cardiovascular: Normal rate, regular rhythm and normal heart sounds. Exam reveals no gallop and no friction rub.  No murmur heard.  Pulmonary/Chest: Effort normal and breath sounds normal. No respiratory distress. He has no wheezes.  Ext: right hand-4th digit swelling more erythema noted distally. 40% of wound bed has eshar. But no purulence noted. Still significant tissue defect. See attached pictures in media. Lymphadenopathy:  He has no cervical or axillary adenopathy.  Neurological: He is alert and oriented to person, place, and time.  Skin: Skin is warm and dry. No rash noted. No erythema.  Psychiatric: He has a normal mood and affect. His behavior is normal.    LABS: Results for orders placed or performed during the hospital encounter of 08/10/22 (from the past 48 hour(s))  C-reactive protein     Status: Abnormal   Collection Time: 08/12/22  9:53 PM  Result Value Ref Range   CRP 10.3 (H) <1.0 mg/dL    Comment: Performed at [facility], [address], [place], [place] [address]  C-reactive protein     Status: Abnormal   Collection Time: 08/14/22  5:24 AM  Result Value Ref Range   CRP 4.6 (H) <1.0 mg/dL    Comment: Performed at [facility] [address] [place] [address]  CBC     Status: None   Collection Time: 08/14/22  5:24 AM  Result Value Ref Range   WBC 9.9 4.0 - 10.5 K/uL   RBC 4.91 4.22 - 5.81 MIL/uL   Hemoglobin 14.8 13.0 - 17.0 g/dL   HCT 44.3 39.0 - 52.0 %   MCV 90.2 80.0 - 100.0 fL   MCH 30.1 26.0 - 34.0 pg   MCHC 33.4 30.0 - 36.0 g/dL   RDW 11.7 11.5 - 15.5 %   Platelets 391 150 - 400 K/uL   nRBC 0.0 0.0 -  0.2 %    Comment: Performed at [facility], [place] [address], [place], [place] [address]  Basic metabolic panel     Status: Abnormal   Collection Time: 08/14/22  5:24 AM  Result Value Ref Range   Sodium 132 (L) 135 - 145 mmol/L   Potassium 3.9 3.5 - 5.1 mmol/L   Chloride 98 98 - 111 mmol/L   CO2 22 22 - 32 mmol/L   Glucose, Bld 94 70 - 99 mg/dL    Comment: Glucose reference range applies only to samples taken after fasting for at least 8 hours.   BUN 19 6 - 20 mg/dL   Creatinine, Ser 1.10 0.61 - 1.24 mg/dL   Calcium 9.1 8.9 - 10.3 mg/dL   GFR, Estimated >60 >60 mL/min    Comment: (NOTE) Calculated using the CKD-EPI Creatinine Equation (2021)    Anion gap 12 5 - 15    Comment: Performed at [facility], [place] [address] [place],  [address]    MICRO: reviewed IMAGING: No results found.  Assessment/Plan:  37yo M with skin/soft tissue infection of 4th finger. Trauma associated with puncture from cactus needle. Underwent I x D and started on empiric abtx including antifungals. Showing much improvement. - will have [facility] add aerobic/anaerobic cultures - plan to continue on iv therapy while hospitalized then discharge on amox/clav plus doxycycline x 14 days - we will hold off of antifungal coverage. Appears healing quickly not sure would anticipate that with antifungal treatment for sporothrix. Will continue to follow up on fungal cultures - would have dr Ala Bent to evaluate in the am to see if need further debridement vs. Wound care instructions with close outpatient followup.

## 2022-08-14 NOTE — Progress Notes (Addendum)
  Progress Note   Patient: Daniel Terry QBV:[id number] DOB: May 07, 1986 DOA: 08/10/2022     4 DOS: the patient was seen and examined on 08/14/2022 at 9:32AM      Brief hospital course: Daniel Terry is a 37 y.o. M with hx TBI, prior substance use now sober, and seizures, none in >6 months who presented with right ring finger cellulitis from a cactus.     Assessment and Plan: * Right ring finger abscess and cellulitis I&D'd by Dr. Ala Bent at the bedside in the ER.  Aerobic culture sent but unfortunately not processed. Fungal and AFB cultures NGTD.  Overall improving, CRP 19 > 10 > pending today, WBC normalized. Pain improving, patient's movement is better.  Reviewed images of finger (see below) with Orthopedics, who feel this is looking good, as expected - Continue vancomycin and Zosyn and itraconazole for now - Consult ID - Daily antibacterial soap bath and replace packing daily - Trend CBC and CRP     Seizure disorder (Daniel Terry) Patient had frequent seizures when he was drinking.  He stopped drinking sometime in 2023, and seizures got better, so in Sep 2023 he stopped Keppra.    Since then, he's had one seizure he thinks   Today, had an aura, called out to desk for water because he thought he was going to have a seizure.  By the time nursing got to room, he was somewhat disoriented in his answers, didn't remember calling out, seemed dazed.   - Restart Keppra 500 BID    TBI (traumatic brain injury) ([place])    H/O alcohol abuse Denies current use  H/O atrial fibrillation without current medication This looks like an isolated episode in 2021 in context of alcohol intoxication, never recurred that we know of, CHA2DS2-Vasc 0.              Subjective: Patient is feeling comfortable, his finger is feeling better.  No fever.     Physical Exam: BP (!) 129/93 (BP Location: Left Arm)   Pulse 71   Temp (!) 97.4 F (36.3 C) (Oral)   Resp 18   Ht 5\' 6"  (1.676 m)   Wt 72.4  kg   SpO2 100%   BMI 25.76 kg/m   Adult male, lying in bed, no acute distress RRR, no murmurs, no peripheral edema Respiratory rate normal, lungs clear without rales or wheezes Abdomen soft no tenderness palpation or guarding Mental status normal   Data Reviewed: Discussed with Orthopedics ID consulted BMP and CBC reviewed and normal  Family Communication: None present    Disposition: Status is: Inpatient Likely 1 more day IV antibiotics, then discharge tomorrow         Author: Edwin Dada, MD 08/14/2022 11:57 AM  For on call review [email or web address].

## 2022-08-15 ENCOUNTER — Other Ambulatory Visit (HOSPITAL_COMMUNITY): Payer: Self-pay

## 2022-08-15 ENCOUNTER — Telehealth (HOSPITAL_COMMUNITY): Payer: Self-pay | Admitting: Pharmacy Technician

## 2022-08-15 DIAGNOSIS — L02512 Cutaneous abscess of left hand: Secondary | ICD-10-CM | POA: Diagnosis not present

## 2022-08-15 DIAGNOSIS — G40909 Epilepsy, unspecified, not intractable, without status epilepticus: Secondary | ICD-10-CM

## 2022-08-15 DIAGNOSIS — L03011 Cellulitis of right finger: Secondary | ICD-10-CM | POA: Diagnosis not present

## 2022-08-15 LAB — CULTURE, BLOOD (ROUTINE X 2)
Culture: NO GROWTH
Culture: NO GROWTH
Special Requests: ADEQUATE
Special Requests: ADEQUATE

## 2022-08-15 LAB — BASIC METABOLIC PANEL
Anion gap: 10 (ref 5–15)
BUN: 16 mg/dL (ref 6–20)
CO2: 24 mmol/L (ref 22–32)
Calcium: 8.9 mg/dL (ref 8.9–10.3)
Chloride: 102 mmol/L (ref 98–111)
Creatinine, Ser: 0.85 mg/dL (ref 0.61–1.24)
GFR, Estimated: >60 mL/min (ref >60)
Glucose, Bld: 98 mg/dL (ref 70–99)
Potassium: 3.9 mmol/L (ref 3.5–5.1)
Sodium: 136 mmol/L (ref 135–145)

## 2022-08-15 LAB — C-REACTIVE PROTEIN: CRP: 2 mg/dL — ABNORMAL HIGH (ref <1.0)

## 2022-08-15 MED ORDER — LEVETIRACETAM 500 MG PO TABS
500.0000 mg | ORAL_TABLET | Freq: Two times a day (BID) | ORAL | 3 refills | Status: DC
  Filled 2022-08-15: qty 60, 30d supply, fill #0

## 2022-08-15 MED ORDER — AMOXICILLIN-POT CLAVULANATE 875-125 MG PO TABS
1.0000 | ORAL_TABLET | Freq: Two times a day (BID) | ORAL | 0 refills | Status: DC
  Filled 2022-08-15: qty 28, 14d supply, fill #0

## 2022-08-15 MED ORDER — COLLAGENASE 250 UNIT/GM EX OINT: TOPICAL_OINTMENT | Freq: Every day | CUTANEOUS | 0 refills | Status: AC

## 2022-08-15 MED ORDER — DOXYCYCLINE HYCLATE 100 MG PO CAPS
100.0000 mg | ORAL_CAPSULE | Freq: Two times a day (BID) | ORAL | 0 refills | Status: DC
  Filled 2022-08-15: qty 28, 14d supply, fill #0

## 2022-08-15 MED ORDER — CHLORHEXIDINE GLUCONATE 4 % EX LIQD
Freq: Every day | CUTANEOUS | 0 refills | Status: AC
  Filled 2022-08-15: qty 118, 10d supply, fill #0

## 2022-08-15 NOTE — Telephone Encounter (Signed)
Patient Advocate Encounter  Prior Authorization for Linezolid 600MG  tablets has been approved.    PA# [id number] Key: Shirlee Latch Effective dates: 08/15/2022 through 09/12/2022  Patients co-pay is $4.17.     Lyndel Safe, [place] Patient Advocate Specialist [place] Patient Advocate Team Direct Number: [phone]  Fax: [phone]

## 2022-08-15 NOTE — Discharge Summary (Signed)
Physician Discharge Summary   Patient: Daniel Terry MRN: [id number] DOB: Aug 14, 1985  Admit date:     08/10/2022  Discharge date: 08/15/22  Discharge Physician: Edwin Dada   PCP: Pcp, No     Recommendations at discharge:  Follow up with Dr. Ala Bent in 1 week for right 4th digit infection Follow up with [place] Follow up with [facility] Neurology for seizures Dr. Ala Bent:  Please follow up AFB and fungal cultures sent to [facility] Aerobic culture requested as add-on at [employer]; Please follow up     Discharge Diagnoses: Principal Problem:   Right ring finger abscess and cellulitis Active Problems:   Seizure disorder     H/O atrial fibrillation without current medication   H/O alcohol abuse   TBI (traumatic brain injury)         Hospital Course: [name] is a 37 y.o. M with hx TBI, prior substance use now sober, and seizures, none in >6 months who presented with right ring finger cellulitis from a cactus.        * Right ring finger abscess and cellulitis I&D'd by Dr. Ala Bent at the bedside in the ER.  Aerobic culture sent but unfortunately not processed. Fungal and AFB cultures no growth at the time of discharge  Aerobic culture requested as add on.  Overall improving, CRP 19 > 10 > 4 > 2 on day of discharge.  WBC normalized.    ID consulted, recommended 14 days Augmentin and doxycycline at discharge.  Has Ortho follow up.    Seizure disorder [facility]) Patient had frequent seizures when he was drinking.  He stopped drinking sometime in 2023, and seizures got better, so in Sep 2023 he stopped Keppra.    Since stopping Keppra, he had one seizure at home.  In the hospital, on hospital day 3, he had an aura, called out to desk for water because he thought he was going to have a seizure.  By the time nursing got to room, he was somewhat disoriented in his answers, didn't remember calling out, seemed dazed.     Suspect he had a brief or partial seizure.  Keppra restarted.  Neurology follow up referral sent.   Keppra refill sent at discharge.    TBI (traumatic brain injury) ([place])    H/O alcohol abuse Denies current use  H/O atrial fibrillation without current medication This looks like an isolated episode in 2021 in context of alcohol intoxication, never recurred that we know of, CHA2DS2-Vasc 0.               The [facility] Controlled Substances Registry was reviewed for this patient prior to discharge.  Consultants: Orthopedics, Dr. Ala Bent Infectious [place], Dr. Baxter Flattery  Procedures performed: Incision and drainage of right ring finger abscess   Disposition: Home Diet recommendation:  Regular  DISCHARGE MEDICATION: Allergies as of 08/15/2022       Reactions   Diphenhydramine Hcl    Other reaction(s): Lethargy (intolerance) Excessive drowsiness        Medication List     TAKE these medications    amoxicillin-clavulanate 875-125 MG tablet Commonly known as: AUGMENTIN Take 1 tablet by mouth 2 (two) times daily.   chlorhexidine 4 % external liquid Commonly known as: HIBICLENS Apply topically daily. Start taking on: August 16, 2022   collagenase 250 UNIT/GM ointment Commonly known as: SANTYL Apply topically daily. Start taking on: August 16, 2022   doxycycline 100 MG capsule Commonly known  as: VIBRAMYCIN Take 1 capsule (100 mg total) by mouth 2 (two) times daily.   levETIRAcetam 500 MG tablet Commonly known as: KEPPRA Take 1 tablet (500 mg total) by mouth 2 (two) times daily.               Discharge Care Instructions  (From admission, onward)           Start     Ordered   08/15/22 0000  Discharge wound care:       Comments: Daily: Remove dressing and packing and soak in warm soapy water (Hibiclens soap or Johnson's baby soap) For the first 3 days, apply Santyl ointment to wound bed and sloughing tissue Then repack gently with  iodoform, cover with nonstick dressing, then wrap with Gauze  Change dressing once daily or if it gets dirty   08/15/22 1303            Follow-up Information     Chiaramonti, Grafton Folk, MD. Schedule an appointment as soon as possible for a visit in 1 week(s).   Specialties: Orthopedic Surgery, Hand Surgery Contact information: [place] [address] [phone]         [place]             . Schedule an appointment as soon as possible for a visit in 1 week(s).   Contact information: [address] [address] [facility]. Schedule an appointment as soon as possible for a visit in 2 month(s).   Specialty: Neurology Contact information: [address] [place] [phone]                Discharge Instructions     Ambulatory referral to Neurology   Complete by: As directed    An appointment is requested in approximately: 8 weeks For epilepsy   Discharge instructions   Complete by: As directed    **IMPORTANT DISCHARGE INSTRUCTIONS**   From Dr. Loleta Books: You were admitted for hand infection You had an I&D (incision and drainage) of the pus pocket while here You were treated with IV antibiotics and this improved You should follow up with Dr. Ala Bent in his office Call today for an appointment in 1 week Tell them you were in the hospital, Dr. Ala Bent saw you and drained an abscess on your finger and you need a follow up within 1 week if able   Take the antibiotics Augmentin and doxycycline for 14 days (stopping on Feb 13) Take Augmentin 875-125 mg twice daily starting tonight Take doxycycline 100 mg twice daily starting tonight   Call the [facility] (see below in To Do section) and ask for an appointment Same as above, tell them you were in  the hospital, had a finger abscess drained, and need wound care follow up  If you have fever, worsening redness, worsening pain, worsening swelling, call Dr. Alexis Goodell on call number first (call his office and they will direct you to the on call number)   Also, we think you had a seizure while here. We have sent refills of your Keppra/levitiracetam 500 mg Take this twice daily  I have sent a referral to [facility] Neurology for a follow up appointment for seizures They will contact you If you havent' heard from them in 10 days, call the number below   Discharge wound care:  Complete by: As directed    Daily: Remove dressing and packing and soak in warm soapy water (Hibiclens soap or Johnson's baby soap) For the first 3 days, apply Santyl ointment to wound bed and sloughing tissue Then repack gently with iodoform, cover with nonstick dressing, then wrap with Gauze  Change dressing once daily or if it gets dirty   Increase activity slowly   Complete by: As directed        Discharge Exam: Filed Weights   08/10/22 1633  Weight: 72.4 kg    General: Pt is alert, awake, not in acute distress Cardiovascular: RRR, nl S1-S2, no murmurs appreciated.   No LE edema.   Respiratory: Normal respiratory rate and rhythm.  CTAB without rales or wheezes. MSK: Wound with some healthy granulation tissue, redness is receding, some eschar noted. Abdominal: Abdomen soft and non-tender.  No distension or HSM.   Neuro/Psych: Strength symmetric in upper and lower extremities.  Judgment and insight appear normal.     Condition at discharge: stable  The results of significant diagnostics from this hospitalization (including imaging, microbiology, ancillary and laboratory) are listed below for reference.   Imaging Studies: CT HAND RIGHT W CONTRAST  Result Date: 08/10/2022 CLINICAL DATA:  Soft tissue mass. Swelling and pain to the fourth finger. Stuck by cactus 4 days ago. EXAM: CT OF THE UPPER  RIGHT EXTREMITY WITH CONTRAST TECHNIQUE: Multidetector CT imaging of the upper right extremity was performed according to the standard protocol following intravenous contrast administration. RADIATION DOSE REDUCTION: This exam was performed according to the departmental dose-optimization program which includes automated exposure control, adjustment of the mA and/or kV according to patient size and/or use of iterative reconstruction technique. CONTRAST:  OMNIPAQUE IOHEXOL 300 MG/ML  SOLN COMPARISON:  Right hand x-ray same day FINDINGS: Bones/Joint/Cartilage No evidence for fracture, focal osseous lesion or cortical erosion. No dislocation. Healed fourth and fifth metacarpal fractures. Ligaments Suboptimally assessed by CT. Muscles and Tendons Within normal limits. Soft tissues There is fluid collection surrounding the dorsal aspect of the fourth finger at the level of the proximal interphalangeal joint. Fluid measures 2.3 x 0.9 x 4.0 cm in largest transverse, AP and craniocaudad dimensions respectively. There is no evidence for soft tissue gas. Is unclear whether this communicates with the joint space. No foreign body identified. There is diffuse subcutaneous edema and skin thickening of the fourth finger. There is likely joint effusion at the fourth metacarpophalangeal joint. This is best seen dorsally. IMPRESSION: 1. Fluid collection surrounding the dorsal aspect of the fourth finger at the level of the proximal interphalangeal joint worrisome for abscess. It is unclear whether this communicates with the joint space. 2. Diffuse subcutaneous edema and skin thickening of the fourth finger compatible with cellulitis. 3. Probable joint effusion at the fourth metacarpophalangeal joint. Septic arthritis not excluded. 4. No acute osseous abnormality. Electronically Signed   By: Darliss Cheney M.D.   On: 08/10/2022 19:26   DG Hand Complete Right  Result Date: 08/10/2022 CLINICAL DATA:  Cactus injury 4 days ago,  hand swelling and drainage, fourth finger EXAM: RIGHT HAND - COMPLETE 3+ VIEW COMPARISON:  01/17/2014 FINDINGS: Suboptimal positioning, presumably the patient is unable to extend the fingers. Old healed boxer's fractures of the fourth and fifth metacarpals. No acute fracture or bony destructive findings. Substantial swelling of the fourth finger is appreciated. No obvious gas tracking in the soft tissues. IMPRESSION: 1. Substantial soft tissue swelling of the fourth finger. No gas tracking in the soft  tissues. No radiographically visible foreign body. 2. Old healed Boxer's fractures of the fourth and fifth metacarpals. Electronically Signed   By: Van Clines M.D.   On: 08/10/2022 17:18    Microbiology: Results for orders placed or performed during the hospital encounter of 08/10/22  Blood culture (routine x 2)     Status: None   Collection Time: 08/10/22  6:18 PM   Specimen: BLOOD  Result Value Ref Range Status   Specimen Description   Final    BLOOD LEFT ANTECUBITAL Performed at [facility] [address] [place], [place] [address]    Special Requests   Final    BOTTLES DRAWN AEROBIC AND ANAEROBIC Blood Culture adequate volume Performed at [facility] [address] [place], [place] [address]    Culture   Final    NO GROWTH 5 DAYS Performed at [facility], [place] [address] [place], [place] [address]    Report Status 08/15/2022 FINAL  Final  Blood culture (routine x 2)     Status: None   Collection Time: 08/10/22  6:30 PM   Specimen: BLOOD  Result Value Ref Range Status   Specimen Description   Final    BLOOD RIGHT ANTECUBITAL Performed at [facility] [address], [place], [place] [address]    Special Requests   Final    BOTTLES DRAWN AEROBIC AND ANAEROBIC Blood Culture adequate volume Performed at [facility] [address] [place], [place] [address]    Culture   Final    NO  GROWTH 5 DAYS Performed at [facility], [place] [address] [place], [place] [address]    Report Status 08/15/2022 FINAL  Final    Labs: CBC: Recent Labs  Lab 08/10/22 1655 08/10/22 2221 08/11/22 1223 08/14/22 0524  WBC 25.1* 22.4* 16.9* 9.9  NEUTROABS 20.7*  --  13.3*  --   HGB 14.0 13.2 12.6* 14.8  HCT 41.1 38.8* 37.8* 44.3  MCV 90.7 90.4 91.3 90.2  PLT 399 387 375 694   Basic Metabolic Panel: Recent Labs  Lab 08/10/22 1655 08/10/22 2221 08/11/22 1223 08/14/22 0524 08/15/22 0610  NA 130*  --  132* 132* 136  K 3.6  --  3.9 3.9 3.9  CL 95*  --  99 98 102  CO2 26  --  23 22 24   GLUCOSE 119*  --  138* 94 98  BUN 8  --  8 19 16   CREATININE 0.78 0.80 0.73 1.10 0.85  CALCIUM 9.0  --  8.4* 9.1 8.9   Liver Function Tests: Recent Labs  Lab 08/10/22 1655  AST 20  ALT 27  ALKPHOS 97  BILITOT 0.5  PROT 8.0  ALBUMIN 3.8   CBG: No results for input(s): "GLUCAP" in the last 168 hours.  Discharge time spent: approximately 35 minutes spent on discharge counseling, evaluation of patient on day of discharge, and coordination of discharge planning with nursing, social work, pharmacy and case management  Signed: Edwin Dada, MD [facility] 08/15/2022

## 2022-08-15 NOTE — TOC Benefit Eligibility Note (Signed)
Patient Teacher, English as a foreign language completed.    The patient is currently admitted and upon discharge could be taking linezolid (Zyvox) 600 mg tablets.  Required Prior Authorization  The patient is insured through [place], [place] Patient Advocate Specialist [place] Patient Advocate Team Direct Number: [phone]  Fax: [phone]

## 2022-08-17 ENCOUNTER — Other Ambulatory Visit (HOSPITAL_COMMUNITY): Payer: Self-pay

## 2022-08-22 LAB — ACID FAST SMEAR (AFB, MYCOBACTERIA): Acid Fast Smear: NEGATIVE

## 2022-08-24 ENCOUNTER — Emergency Department (HOSPITAL_COMMUNITY)
Admission: EM | Admit: 2022-08-24 | Discharge: 2022-08-24 | Disposition: A | Discharge status: Home / Self Care | Payer: Commercial Managed Care - HMO | Attending: Emergency Medicine | Admitting: Emergency Medicine

## 2022-08-24 ENCOUNTER — Encounter (HOSPITAL_COMMUNITY): Payer: Self-pay

## 2022-08-24 ENCOUNTER — Other Ambulatory Visit: Payer: Self-pay

## 2022-08-24 DIAGNOSIS — Z5189 Encounter for other specified aftercare: Secondary | ICD-10-CM

## 2022-08-24 DIAGNOSIS — Z4800 Encounter for change or removal of nonsurgical wound dressing: Secondary | ICD-10-CM | POA: Insufficient documentation

## 2022-08-24 MED ORDER — AMOXICILLIN-POT CLAVULANATE 875-125 MG PO TABS: 1.0000 | ORAL_TABLET | Freq: Two times a day (BID) | ORAL | 0 refills | Status: DC

## 2022-08-24 MED ORDER — LEVETIRACETAM 500 MG PO TABS: 500.0000 mg | ORAL_TABLET | Freq: Two times a day (BID) | ORAL | 3 refills | Status: DC

## 2022-08-24 MED ORDER — DOXYCYCLINE HYCLATE 100 MG PO CAPS: 100.0000 mg | ORAL_CAPSULE | Freq: Two times a day (BID) | ORAL | 0 refills | Status: DC

## 2022-08-24 MED ORDER — DOXYCYCLINE HYCLATE 100 MG PO TABS
100.0000 mg | ORAL_TABLET | Freq: Once | ORAL | Status: AC
  Administered 2022-08-24: 100 mg via ORAL
  Filled 2022-08-24: qty 1

## 2022-08-24 MED ORDER — COLLAGENASE 250 UNIT/GM EX OINT
TOPICAL_OINTMENT | Freq: Every day | CUTANEOUS | Status: DC
  Administered 2022-08-24: 1 via TOPICAL
  Filled 2022-08-24: qty 30

## 2022-08-24 MED ORDER — AMOXICILLIN-POT CLAVULANATE 875-125 MG PO TABS
1.0000 | ORAL_TABLET | Freq: Once | ORAL | Status: AC
  Administered 2022-08-24: 1 via ORAL
  Filled 2022-08-24: qty 1

## 2022-08-24 NOTE — ED Provider Notes (Signed)
[name] EMERGENCY DEPARTMENT AT [facility] Provider Note   CSN: [id number] Arrival date & time: 08/24/22  1717     History  Chief Complaint  Patient presents with   Wound Check    Daniel Terry is a 37 y.o. male.  Patient is a 37 year old male with a history of TBI, recent admission due to a finger abscess and seizures who is returning to the hospital today because he is concerned that his wound is not healing.  When patient was discharged from the hospital last week he was given prescriptions for doxycycline, Augmentin and follow-up with Dr. Sallye Lat yesterday and wound care but he did not follow-up with any of those appointments and did not pick up his prescriptions for the antibiotics.  Patient reports because of his traumatic brain injury he has a hard time remembering things at times so he has been putting alcohol and peroxide on his finger but reports it is not getting better.  He has not had fever and otherwise feels normal.  The wound has not been draining.  The history is provided by the patient.  Wound Check       Home Medications Prior to Admission medications   Medication Sig Start Date End Date Taking? Authorizing Provider  amoxicillin-clavulanate (AUGMENTIN) 875-125 MG tablet Take 1 tablet by mouth 2 (two) times daily. 08/24/22   Blanchie Dessert, MD  chlorhexidine (HIBICLENS) 4 % external liquid Apply topically daily. 08/16/22   Danford, Suann Larry, MD  collagenase (SANTYL) 250 UNIT/GM ointment Apply topically daily. 08/16/22   Danford, Suann Larry, MD  doxycycline (VIBRAMYCIN) 100 MG capsule Take 1 capsule (100 mg total) by mouth 2 (two) times daily. 08/24/22   Blanchie Dessert, MD  levETIRAcetam (KEPPRA) 500 MG tablet Take 1 tablet (500 mg total) by mouth 2 (two) times daily. 08/24/22   Blanchie Dessert, MD      Allergies    Diphenhydramine hcl    Review of Systems   Review of Systems  Physical Exam Updated Vital Signs BP (!) 142/97    Pulse 97   Temp 98.4 F (36.9 C) (Oral)   Resp 18   SpO2 99%  Physical Exam Vitals and nursing note reviewed.  Cardiovascular:     Rate and Rhythm: Normal rate.  Pulmonary:     Effort: Pulmonary effort is normal.  Musculoskeletal:        General: Swelling and tenderness present.     Comments: Healing wound present on the right ring finger.  No drainage.  Swelling and erythema still noted.  Minimal tenderness.  No swelling down on the hand or wrist.  Skin:    General: Skin is warm.  Neurological:     Mental Status: He is alert. Mental status is at baseline.     ED Results / Procedures / Treatments   Labs (all labs ordered are listed, but only abnormal results are displayed) Labs Reviewed - No data to display  EKG None  Radiology No results found.  Procedures Procedures    Medications Ordered in ED Medications  amoxicillin-clavulanate (AUGMENTIN) 875-125 MG per tablet 1 tablet (has no administration in time range)  doxycycline (VIBRA-TABS) tablet 100 mg (has no administration in time range)  collagenase (SANTYL) ointment (has no administration in time range)    ED Course/ Medical Decision Making/ A&P                             Medical  Decision Making Risk Prescription drug management.   Patient here due to ongoing wound on his finger.  Based on pictures from 29 January his wound is healing.  However patient has memory issues and did not follow-up with the orthopedist or the wound care center.  He did not start the antibiotics.  He now has a concern friend with him and went over everything with her.  Patient is well-appearing here and no signs or concerns for sepsis.  Patient was given a dose of antibiotics here and given new prescriptions.  Also given prescriptions for his Keppra.  He has the paperwork of the physicians he should follow-up with.  Encouraged him to call those numbers tomorrow to see if he can make new appointments.  Xeroform was placed on his wound  and it was wrapped.  He was repeatedly asked to stop putting alcohol and peroxide on his wound.  However overall it does look better than when he was in the hospital.        Final Clinical Impression(s) / ED Diagnoses Final diagnoses:  Visit for wound check    Rx / DC Orders ED Discharge Orders          Ordered    amoxicillin-clavulanate (AUGMENTIN) 875-125 MG tablet  2 times daily       Note to Pharmacy: Please deliver meds to bed   08/24/22 1754    doxycycline (VIBRAMYCIN) 100 MG capsule  2 times daily       Note to Pharmacy: Please deliver meds to bed   08/24/22 1754    levETIRAcetam (KEPPRA) 500 MG tablet  2 times daily       Note to Pharmacy: Please deliver meds to bed   08/24/22 1754              Blanchie Dessert, MD 08/24/22 1755

## 2022-08-24 NOTE — ED Notes (Signed)
Pt wound wrapped with xeroform gauze and roller gauze to prevent further injury or infection

## 2022-08-24 NOTE — ED Triage Notes (Signed)
Pt arriving with open wound to ring finger right hand. Pt c/o increased swelling, pain, and redness on his wound to the ring finger right hand. Pt states he was discharged a week ago from [facility] after being admitted for the wound. Pt states he was sent home with antibiotics and did not get them filled, has been taking no antibiotics since leaving the hospital. Right hand ring finger has redness, swelling, open wound with no bleeding at this time.

## 2022-08-24 NOTE — Discharge Instructions (Addendum)
Take the first dose of your antibiotics tomorrow.  Use the ointment on your finger every day.  Avoid alcohol and peroxide.

## 2022-08-29 ENCOUNTER — Inpatient Hospital Stay: Payer: Commercial Managed Care - HMO | Admitting: Internal Medicine

## 2022-08-29 ENCOUNTER — Telehealth: Payer: Self-pay

## 2022-08-29 NOTE — Telephone Encounter (Signed)
Attempted to call patient today regarding Hospital follow up appointment. Call was answered by patient's mother who states patient will most likely not be coming in today due to her going to hospital. Requested she have patient call office to reschedule visit.  Leatrice Jewels, RMA

## 2022-08-31 ENCOUNTER — Other Ambulatory Visit (HOSPITAL_COMMUNITY): Payer: Self-pay

## 2022-09-11 LAB — FUNGUS CULTURE WITH STAIN

## 2022-09-11 LAB — FUNGUS CULTURE RESULT

## 2022-09-11 LAB — FUNGAL ORGANISM REFLEX

## 2022-10-04 LAB — ACID FAST CULTURE WITH REFLEXED SENSITIVITIES (MYCOBACTERIA): Acid Fast Culture: NEGATIVE

## 2024-03-25 ENCOUNTER — Encounter (HOSPITAL_COMMUNITY): Payer: Self-pay

## 2024-03-25 ENCOUNTER — Other Ambulatory Visit: Payer: Self-pay

## 2024-03-25 ENCOUNTER — Emergency Department (HOSPITAL_COMMUNITY)
Admission: EM | Admit: 2024-03-25 | Discharge: 2024-03-26 | Disposition: A | Discharge status: Home / Self Care | Payer: Self-pay | Attending: Emergency Medicine | Admitting: Emergency Medicine

## 2024-03-25 DIAGNOSIS — R739 Hyperglycemia, unspecified: Secondary | ICD-10-CM | POA: Insufficient documentation

## 2024-03-25 DIAGNOSIS — E871 Hypo-osmolality and hyponatremia: Secondary | ICD-10-CM | POA: Insufficient documentation

## 2024-03-25 DIAGNOSIS — D72829 Elevated white blood cell count, unspecified: Secondary | ICD-10-CM | POA: Insufficient documentation

## 2024-03-25 DIAGNOSIS — Z91148 Patient's other noncompliance with medication regimen for other reason: Secondary | ICD-10-CM | POA: Insufficient documentation

## 2024-03-25 DIAGNOSIS — R569 Unspecified convulsions: Secondary | ICD-10-CM | POA: Insufficient documentation

## 2024-03-25 LAB — CBC WITH DIFFERENTIAL/PLATELET
Abs Immature Granulocytes: 0.13 K/uL — ABNORMAL HIGH (ref 0.00–0.07)
Basophils Absolute: 0.1 K/uL (ref 0.0–0.1)
Basophils Relative: 0 %
Eosinophils Absolute: 0 K/uL (ref 0.0–0.5)
Eosinophils Relative: 0 %
HCT: 42.1 % (ref 39.0–52.0)
Hemoglobin: 14.3 g/dL (ref 13.0–17.0)
Immature Granulocytes: 1 %
Lymphocytes Relative: 5 %
Lymphs Abs: 1.2 K/uL (ref 0.7–4.0)
MCH: 30.4 pg (ref 26.0–34.0)
MCHC: 34 g/dL (ref 30.0–36.0)
MCV: 89.4 fL (ref 80.0–100.0)
Monocytes Absolute: 1 K/uL (ref 0.1–1.0)
Monocytes Relative: 5 %
Neutro Abs: 20.1 K/uL — ABNORMAL HIGH (ref 1.7–7.7)
Neutrophils Relative %: 89 %
Platelets: 360 K/uL (ref 150–400)
RBC: 4.71 MIL/uL (ref 4.22–5.81)
RDW: 12.1 % (ref 11.5–15.5)
WBC: 22.5 K/uL — ABNORMAL HIGH (ref 4.0–10.5)
nRBC: 0 % (ref 0.0–0.2)

## 2024-03-25 MED ORDER — LEVETIRACETAM (KEPPRA) 500 MG/5 ML ADULT IV PUSH
1000.0000 mg | Freq: Once | INTRAVENOUS | Status: AC
  Administered 2024-03-25: 1000 mg via INTRAVENOUS
  Filled 2024-03-25: qty 10

## 2024-03-25 NOTE — ED Triage Notes (Signed)
 Pt arrives EMS with reports of multiple seizures tonight per girlfriend. Pt has hx of seizures and has been off of medications for the last 4 months.

## 2024-03-25 NOTE — ED Provider Notes (Signed)
 [place] EMERGENCY DEPARTMENT AT [facility] Provider Note   CSN: [id number] Arrival date & time: 03/25/24  2245     Patient presents with: Seizures   Daniel Terry is a 38 y.o. male.  {Add pertinent medical, surgical, social history, OB history to YEP:67052} The history is provided by the EMS personnel. The history is limited by the condition of the patient (Altered mental status).  Seizures  Patient is reported to have had multiple seizures tonight, also reported to have been off of his anticonvulsant medication for the last 4 months.    Prior to Admission medications   Medication Sig Start Date End Date Taking? Authorizing Provider  amoxicillin -clavulanate (AUGMENTIN ) 875-125 MG tablet Take 1 tablet by mouth 2 (two) times daily. 08/24/22   Doretha Folks, MD  chlorhexidine  (HIBICLENS ) 4 % external liquid Apply topically daily. 08/16/22   Danford, Lonni SQUIBB, MD  collagenase  (SANTYL ) 250 UNIT/GM ointment Apply topically daily. 08/16/22   Danford, Lonni SQUIBB, MD  doxycycline  (VIBRAMYCIN ) 100 MG capsule Take 1 capsule (100 mg total) by mouth 2 (two) times daily. 08/24/22   Doretha Folks, MD  levETIRAcetam  (KEPPRA ) 500 MG tablet Take 1 tablet (500 mg total) by mouth 2 (two) times daily. 08/24/22   Doretha Folks, MD    Allergies: Diphenhydramine hcl    Review of Systems  Unable to perform ROS: Mental status change  Neurological:  Positive for seizures.    Updated Vital Signs BP (!) 133/93   Pulse 96   Temp 97.8 F (36.6 C) (Oral)   Resp 18   Wt 65.8 kg   SpO2 94%   BMI 23.40 kg/m   Physical Exam Vitals and nursing note reviewed.   38 year old male, resting comfortably and in no acute distress. Vital signs are significant for elevated diastolic blood pressure. Oxygen saturation is 94%, which is normal. Head is normocephalic and atraumatic. PERRLA, EOMI. Oropharynx is clear. Neck is nontender and supple. Lungs are clear without rales, wheezes, or  rhonchi. Chest is nontender. Heart has regular rate and rhythm without murmur. Abdomen is soft, flat, nontender. Extremities have no cyanosis or edema, full range of motion is present. Skin is warm and dry without rash. Neurologic: Awake and alert but nonverbal, moves all extremities equally.  (all labs ordered are listed, but only abnormal results are displayed) Labs Reviewed - No data to display  EKG: None  Radiology: No results found.  {Document cardiac monitor, telemetry assessment procedure when appropriate:32947} Procedures  Cardiac monitor shows normal sinus rhythm, per my interpretation. Medications Ordered in the ED - No data to display    {Click here for ABCD2, HEART and other calculators REFRESH Note before signing:1}                              Medical Decision Making  Multiple seizures in patient with known history of seizure disorder and off anticonvulsants.  I suspect that he is currently postictal.  I have ordered a loading dose of levetiracetam  and screening labs.  I have reviewed his past records and note ED visit for seizure on 10/26/2022, 2 hospitalizations for seizures in 2022.  {Document critical care time when appropriate  Document review of labs and clinical decision tools ie CHADS2VASC2, etc  Document your independent review of radiology images and any outside records  Document your discussion with family members, caretakers and with consultants  Document social determinants of health affecting pt's care  Document your decision making why or why not admission, treatments were needed:32947:::1}   Final diagnoses:  None    ED Discharge Orders     None

## 2024-03-26 ENCOUNTER — Other Ambulatory Visit: Payer: Self-pay

## 2024-03-26 ENCOUNTER — Emergency Department (HOSPITAL_COMMUNITY)
Admission: EM | Admit: 2024-03-26 | Discharge: 2024-03-27 | Disposition: A | Discharge status: Home / Self Care | Payer: Self-pay | Attending: Emergency Medicine | Admitting: Emergency Medicine

## 2024-03-26 DIAGNOSIS — R569 Unspecified convulsions: Secondary | ICD-10-CM | POA: Insufficient documentation

## 2024-03-26 DIAGNOSIS — G40909 Epilepsy, unspecified, not intractable, without status epilepticus: Secondary | ICD-10-CM

## 2024-03-26 LAB — BASIC METABOLIC PANEL WITH GFR
Anion gap: 9 (ref 5–15)
BUN: 12 mg/dL (ref 6–20)
CO2: 23 mmol/L (ref 22–32)
Calcium: 8.8 mg/dL — ABNORMAL LOW (ref 8.9–10.3)
Chloride: 102 mmol/L (ref 98–111)
Creatinine, Ser: 1.04 mg/dL (ref 0.61–1.24)
GFR, Estimated: >60 mL/min (ref >60)
Glucose, Bld: 129 mg/dL — ABNORMAL HIGH (ref 70–99)
Potassium: 3.7 mmol/L (ref 3.5–5.1)
Sodium: 134 mmol/L — ABNORMAL LOW (ref 135–145)

## 2024-03-26 LAB — MAGNESIUM: Magnesium: 2.1 mg/dL (ref 1.7–2.4)

## 2024-03-26 MED ORDER — LEVETIRACETAM 500 MG PO TABS: 500.0000 mg | ORAL_TABLET | Freq: Two times a day (BID) | ORAL | 3 refills | Status: DC

## 2024-03-26 MED ORDER — ACETAMINOPHEN 325 MG PO TABS
650.0000 mg | ORAL_TABLET | Freq: Once | ORAL | Status: AC
  Administered 2024-03-26: 650 mg via ORAL
  Filled 2024-03-26: qty 2

## 2024-03-26 MED ORDER — IBUPROFEN 400 MG PO TABS
400.0000 mg | ORAL_TABLET | Freq: Once | ORAL | Status: AC
  Administered 2024-03-26: 400 mg via ORAL
  Filled 2024-03-26: qty 1

## 2024-03-26 NOTE — ED Provider Notes (Signed)
 [place] EMERGENCY DEPARTMENT AT [facility] Provider Note   CSN: [id number] Arrival date & time: 03/26/24  9342     Patient presents with: Seizures   Daniel Terry is a 38 y.o. male.   38 year old presents with concern for seizure.  Seen here recently for same and given Keppra .  Was concerned may have another seizure want to lie down.  No seizure activity noted.  No other complaints at this time.       Prior to Admission medications   Medication Sig Start Date End Date Taking? Authorizing Provider  chlorhexidine  (HIBICLENS ) 4 % external liquid Apply topically daily. 08/16/22   Daniel Terry, Daniel Terry SQUIBB, MD  collagenase  (SANTYL ) 250 UNIT/GM ointment Apply topically daily. 08/16/22   Daniel Terry, Daniel Terry SQUIBB, MD  levETIRAcetam  (KEPPRA ) 500 MG tablet Take 1 tablet (500 mg total) by mouth 2 (two) times daily. 03/26/24   Daniel Terry Lenis, MD    Allergies: Diphenhydramine hcl    Review of Systems  All other systems reviewed and are negative.   Updated Vital Signs BP (!) 133/91 (BP Location: Right Arm)   Pulse 86   Temp 97.9 F (36.6 C)   Resp 17   Ht 1.753 m (5' 9)   Wt 65.8 kg   SpO2 100%   BMI 21.41 kg/m   Physical Exam Vitals and nursing note reviewed.  Constitutional:      General: He is not in acute distress.    Appearance: Normal appearance. He is well-developed. He is not toxic-appearing.  HENT:     Head: Normocephalic and atraumatic.  Eyes:     General: Lids are normal.     Conjunctiva/sclera: Conjunctivae normal.     Pupils: Pupils are equal, round, and reactive to light.  Neck:     Thyroid : No thyroid  mass.     Trachea: No tracheal deviation.  Cardiovascular:     Rate and Rhythm: Normal rate and regular rhythm.     Heart sounds: Normal heart sounds. No murmur heard.    No gallop.  Pulmonary:     Effort: Pulmonary effort is normal. No respiratory distress.     Breath sounds: Normal breath sounds. No stridor. No decreased breath sounds, wheezing,  rhonchi or rales.  Abdominal:     General: There is no distension.     Palpations: Abdomen is soft.     Tenderness: There is no abdominal tenderness. There is no rebound.  Musculoskeletal:        General: No tenderness. Normal range of motion.     Cervical back: Normal range of motion and neck supple.  Skin:    General: Skin is warm and dry.     Findings: No abrasion or rash.  Neurological:     General: No focal deficit present.     Mental Status: He is alert and oriented to person, place, and time. Mental status is at baseline.     GCS: GCS eye subscore is 4. GCS verbal subscore is 5. GCS motor subscore is 6.     Cranial Nerves: No cranial nerve deficit.     Sensory: No sensory deficit.     Motor: Motor function is intact.  Psychiatric:        Attention and Perception: Attention normal.        Speech: Speech normal.        Behavior: Behavior normal.     (all labs ordered are listed, but only abnormal results are displayed) Labs Reviewed - No data to  display  EKG: None  Radiology: No results found.   Procedures   Medications Ordered in the ED - No data to display                                  Medical Decision Making  Patient without any new neurological features at this time.  Will be discharged     Final diagnoses:  None    ED Discharge Orders     None          Dasie Faden, MD 03/26/24 1050

## 2024-03-26 NOTE — Discharge Instructions (Addendum)
 It is very important that you take medications to prevent seizures.  If you are having side effects, a neurologist may be able to work with you to prescribe something that still can prevent seizures without causing side effects.  Please remember that the law [place]  states that you are not allowed to drive if you have had a seizure in the last 6 months.

## 2024-03-26 NOTE — ED Triage Notes (Signed)
 Pt bib POV c/o seizure. Pt returned back to lobby after being asked to leave. Pt says he feel like he is about to have a seizure. Pt states he hasn't taken his seizure medication for two days.

## 2024-06-13 ENCOUNTER — Other Ambulatory Visit: Payer: Self-pay

## 2024-06-13 ENCOUNTER — Emergency Department (HOSPITAL_COMMUNITY)
Admission: EM | Admit: 2024-06-13 | Discharge: 2024-06-14 | Disposition: A | Discharge status: Home / Self Care | Payer: Self-pay

## 2024-06-13 ENCOUNTER — Emergency Department (HOSPITAL_COMMUNITY): Payer: Self-pay

## 2024-06-13 DIAGNOSIS — Z91148 Patient's other noncompliance with medication regimen for other reason: Secondary | ICD-10-CM | POA: Insufficient documentation

## 2024-06-13 DIAGNOSIS — R519 Headache, unspecified: Secondary | ICD-10-CM | POA: Insufficient documentation

## 2024-06-13 DIAGNOSIS — R569 Unspecified convulsions: Secondary | ICD-10-CM

## 2024-06-13 DIAGNOSIS — G40909 Epilepsy, unspecified, not intractable, without status epilepticus: Secondary | ICD-10-CM | POA: Insufficient documentation

## 2024-06-13 LAB — COMPREHENSIVE METABOLIC PANEL WITH GFR
ALT: 17 U/L (ref 0–44)
AST: 23 U/L (ref 15–41)
Albumin: 4 g/dL (ref 3.5–5.0)
Alkaline Phosphatase: 93 U/L (ref 38–126)
Anion gap: 9 (ref 5–15)
BUN: 12 mg/dL (ref 6–20)
CO2: 22 mmol/L (ref 22–32)
Calcium: 8.5 mg/dL — ABNORMAL LOW (ref 8.9–10.3)
Chloride: 100 mmol/L (ref 98–111)
Creatinine, Ser: 0.96 mg/dL (ref 0.61–1.24)
GFR, Estimated: >60 mL/min (ref >60)
Glucose, Bld: 117 mg/dL — ABNORMAL HIGH (ref 70–99)
Potassium: 4.3 mmol/L (ref 3.5–5.1)
Sodium: 131 mmol/L — ABNORMAL LOW (ref 135–145)
Total Bilirubin: 0.9 mg/dL (ref 0.0–1.2)
Total Protein: 6.9 g/dL (ref 6.5–8.1)

## 2024-06-13 LAB — CBC WITH DIFFERENTIAL/PLATELET
Abs Immature Granulocytes: 0.05 K/uL (ref 0.00–0.07)
Basophils Absolute: 0.1 K/uL (ref 0.0–0.1)
Basophils Relative: 0 %
Eosinophils Absolute: 0.2 K/uL (ref 0.0–0.5)
Eosinophils Relative: 1 %
HCT: 39.9 % (ref 39.0–52.0)
Hemoglobin: 13.4 g/dL (ref 13.0–17.0)
Immature Granulocytes: 0 %
Lymphocytes Relative: 13 %
Lymphs Abs: 1.9 K/uL (ref 0.7–4.0)
MCH: 29.6 pg (ref 26.0–34.0)
MCHC: 33.6 g/dL (ref 30.0–36.0)
MCV: 88.3 fL (ref 80.0–100.0)
Monocytes Absolute: 0.7 K/uL (ref 0.1–1.0)
Monocytes Relative: 5 %
Neutro Abs: 11.4 K/uL — ABNORMAL HIGH (ref 1.7–7.7)
Neutrophils Relative %: 81 %
Platelets: 317 K/uL (ref 150–400)
RBC: 4.52 MIL/uL (ref 4.22–5.81)
RDW: 12 % (ref 11.5–15.5)
WBC: 14.3 K/uL — ABNORMAL HIGH (ref 4.0–10.5)
nRBC: 0 % (ref 0.0–0.2)

## 2024-06-13 LAB — ETHANOL: Alcohol, Ethyl (B): <15 mg/dL (ref <15)

## 2024-06-13 LAB — CK: Total CK: 79 U/L (ref 49–397)

## 2024-06-13 MED ORDER — LEVETIRACETAM (KEPPRA) 500 MG/5 ML ADULT IV PUSH
1000.0000 mg | Freq: Once | INTRAVENOUS | Status: AC
  Administered 2024-06-13: 1000 mg via INTRAVENOUS
  Filled 2024-06-13: qty 10

## 2024-06-13 MED ORDER — LORAZEPAM 2 MG/ML IJ SOLN
2.0000 mg | Freq: Once | INTRAMUSCULAR | Status: AC
Start: 2024-06-13 — End: 2024-06-13
  Administered 2024-06-13: 2 mg via INTRAVENOUS
  Filled 2024-06-13: qty 1

## 2024-06-13 MED ORDER — PROCHLORPERAZINE EDISYLATE 10 MG/2ML IJ SOLN
10.0000 mg | Freq: Once | INTRAMUSCULAR | Status: AC
  Administered 2024-06-13: 10 mg via INTRAVENOUS
  Filled 2024-06-13: qty 2

## 2024-06-13 MED ORDER — LEVETIRACETAM 500 MG PO TABS: 500.0000 mg | ORAL_TABLET | Freq: Two times a day (BID) | ORAL | 0 refills | Status: AC

## 2024-06-13 MED ORDER — KETOROLAC TROMETHAMINE 15 MG/ML IJ SOLN
15.0000 mg | Freq: Once | INTRAMUSCULAR | Status: AC
  Administered 2024-06-13: 15 mg via INTRAVENOUS
  Filled 2024-06-13: qty 1

## 2024-06-13 NOTE — Discharge Instructions (Signed)
 You will continue to have seizures if left untreated. You are encouraged to take Keppra  twice daily and follow up with your neurologist for regular management of epilepsy.

## 2024-06-13 NOTE — ED Notes (Signed)
 Ct attempted to come take the pt to CT, pt not cooperating with request to turn on his back, not staying awake to provide a urine sample. Will call CT when pt is more awake and can participate.

## 2024-06-13 NOTE — ED Notes (Addendum)
 Contacted pt's mother Tammy per the pt's request and asked her to come pick him up. Will be here in about 35-40 minutes per Tammy.

## 2024-06-13 NOTE — ED Provider Notes (Signed)
 [place] EMERGENCY DEPARTMENT AT [facility] Provider Note   CSN: [id number] Arrival date & time: 06/13/24  1741     Patient presents with: Seizures   Daniel Terry is a 38 y.o. male.   Patient to ED after unwitnessed seizure earlier this afternoon. He reports being prescribed Keppra  but is noncompliant. Multiple previous visits to ED for same after noncompliance. No recent fever, cough, congestion. He reports severe bilateral frontal headache that started after the seizure today. He states a headache after seizure is not uncommon. No vomiting, incontinence, tongue injury.   The history is provided by the patient. No language interpreter was used.  Seizures      Prior to Admission medications   Medication Sig Start Date End Date Taking? Authorizing Provider  chlorhexidine  (HIBICLENS ) 4 % external liquid Apply topically daily. 08/16/22   Danford, Lonni SQUIBB, MD  collagenase  (SANTYL ) 250 UNIT/GM ointment Apply topically daily. 08/16/22   Danford, Lonni SQUIBB, MD  levETIRAcetam  (KEPPRA ) 500 MG tablet Take 1 tablet (500 mg total) by mouth 2 (two) times daily. 06/13/24   Odell Balls, PA-C    Allergies: Diphenhydramine hcl    Review of Systems  Neurological:  Positive for seizures.    Updated Vital Signs BP 115/86   Pulse 85   Temp 97.6 F (36.4 C) (Oral)   Resp 12   Ht 5' 9 (1.753 m)   Wt 68 kg   SpO2 100%   BMI 22.15 kg/m   Physical Exam Vitals and nursing note reviewed.  Constitutional:      Appearance: He is well-developed.  HENT:     Head: Normocephalic.  Cardiovascular:     Rate and Rhythm: Normal rate and regular rhythm.     Heart sounds: No murmur heard. Pulmonary:     Effort: Pulmonary effort is normal.     Breath sounds: Normal breath sounds. No wheezing, rhonchi or rales.  Abdominal:     General: Bowel sounds are normal.     Palpations: Abdomen is soft.     Tenderness: There is no abdominal tenderness. There is no guarding or  rebound.  Musculoskeletal:        General: Normal range of motion.     Cervical back: Normal range of motion and neck supple.  Skin:    General: Skin is warm and dry.  Neurological:     General: No focal deficit present.     Mental Status: He is alert and oriented to person, place, and time.     GCS: GCS eye subscore is 4. GCS verbal subscore is 5. GCS motor subscore is 6.     Cranial Nerves: No cranial nerve deficit, dysarthria or facial asymmetry.     Sensory: Sensation is intact.     Motor: No weakness or pronator drift.     Coordination: Coordination is intact.     (all labs ordered are listed, but only abnormal results are displayed) Labs Reviewed  CBC WITH DIFFERENTIAL/PLATELET - Abnormal; Notable for the following components:      Result Value   WBC 14.3 (*)    Neutro Abs 11.4 (*)    All other components within normal limits  COMPREHENSIVE METABOLIC PANEL WITH GFR - Abnormal; Notable for the following components:   Sodium 131 (*)    Glucose, Bld 117 (*)    Calcium 8.5 (*)    All other components within normal limits  ETHANOL  CK  RAPID URINE DRUG SCREEN, HOSP PERFORMED  URINALYSIS, ROUTINE  W REFLEX MICROSCOPIC    EKG: None  Radiology: No results found.   Procedures   Medications Ordered in the ED  levETIRAcetam  (KEPPRA ) undiluted injection 1,000 mg (1,000 mg Intravenous Given 06/13/24 1828)  ketorolac (TORADOL) 15 MG/ML injection 15 mg (15 mg Intravenous Given 06/13/24 1829)  prochlorperazine (COMPAZINE) injection 10 mg (10 mg Intravenous Given 06/13/24 1829)  LORazepam  (ATIVAN ) injection 2 mg (2 mg Intravenous Given 06/13/24 1854)    Clinical Course as of 06/13/24 2249  Fri Jun 13, 2024  1845 Patient with reported seizure today, with post-seizure headache described as severe. Neurologically intact. Loading dose Keppra  ordered (patient in agreement to receive), and Compazine/decadron for headache. Labs pending.  [SU]  1851 Recheck: patient becoming  restless, agitated, screaming. Just knock me out. Ativan  ordered. Will recheck/re-evaluate. He just received the compazine that was ordered. Consider symptoms of restless agitation may be related.  [SU]  2059 Ativan  provided and patient calms immediately down, goes to sleep. ON recheck he states he feels significantly better, no further headache. He is drowsy, difficulty staying awake. Likely a combination of Keppra  loading and Ativan  with Compazine. Do not feel CT imaging is indicated with complete resolution of HA, common after seizure. Will observe until awake enough for discharge or family/friend is found that can take him home safely.  [SU]  2245 Patient continues to sleep soundly. VSS. Continue to deny any symptoms when he awakened. Discussed discharge home. Offered to call his mother to pick him up and patient agrees. Will encourage him to continue Keppra  to avoid seizures. Follow up neurology. [SU]    Clinical Course User Index [SU] Odell Balls, PA-C                                 Medical Decision Making Amount and/or Complexity of Data Reviewed Labs: ordered. Radiology: ordered.  Risk Prescription drug management.        Final diagnoses:  Seizure (HCC)  Seizure disorder (HCC)  H/O medication noncompliance    ED Discharge Orders          Ordered    levETIRAcetam  (KEPPRA ) 500 MG tablet  2 times daily       Note to Pharmacy: Please deliver meds to bed   06/13/24 2248               Odell Balls, PA-C 06/13/24 2249    Simon Lavonia SAILOR, MD 06/13/24 2318

## 2024-06-13 NOTE — ED Triage Notes (Signed)
 Pt BIB [facility] following an unwitnessed seizure. Found by witness smacking the floor in the bathroom. Patient has a hx of seizures, does not take prescribed medication (Keppra ) as it makes him have seizures. Patient states smoking marijuana normally helps prevent a seizure if he feels it oncoming.   Patient c/o intense headache and nausea. 1 episode of emesis en route. 4 mg Zofran  given 18 L forearm  ST, 132/80, CBG 221

## 2024-08-01 ENCOUNTER — Other Ambulatory Visit: Payer: Self-pay

## 2024-08-01 ENCOUNTER — Emergency Department (HOSPITAL_COMMUNITY)
Admission: EM | Admit: 2024-08-01 | Discharge: 2024-08-01 | Discharge status: Against Medical Advice | Payer: Self-pay | Attending: Emergency Medicine | Admitting: Emergency Medicine

## 2024-08-01 DIAGNOSIS — W57XXXA Bitten or stung by nonvenomous insect and other nonvenomous arthropods, initial encounter: Secondary | ICD-10-CM | POA: Insufficient documentation

## 2024-08-01 DIAGNOSIS — S50862A Insect bite (nonvenomous) of left forearm, initial encounter: Secondary | ICD-10-CM | POA: Insufficient documentation

## 2024-08-01 DIAGNOSIS — S60562A Insect bite (nonvenomous) of left hand, initial encounter: Secondary | ICD-10-CM | POA: Insufficient documentation

## 2024-08-01 DIAGNOSIS — Z5321 Procedure and treatment not carried out due to patient leaving prior to being seen by health care provider: Secondary | ICD-10-CM | POA: Insufficient documentation

## 2024-08-01 LAB — COMPREHENSIVE METABOLIC PANEL WITH GFR
ALT: 22 U/L (ref 0–44)
AST: 20 U/L (ref 15–41)
Albumin: 4.4 g/dL (ref 3.5–5.0)
Alkaline Phosphatase: 122 U/L (ref 38–126)
Anion gap: 10 (ref 5–15)
BUN: 11 mg/dL (ref 6–20)
CO2: 27 mmol/L (ref 22–32)
Calcium: 9.2 mg/dL (ref 8.9–10.3)
Chloride: 103 mmol/L (ref 98–111)
Creatinine, Ser: 0.93 mg/dL (ref 0.61–1.24)
GFR, Estimated: >60 mL/min
Glucose, Bld: 94 mg/dL (ref 70–99)
Potassium: 4.1 mmol/L (ref 3.5–5.1)
Sodium: 139 mmol/L (ref 135–145)
Total Bilirubin: 0.3 mg/dL (ref 0.0–1.2)
Total Protein: 7.5 g/dL (ref 6.5–8.1)

## 2024-08-01 LAB — CBC WITH DIFFERENTIAL/PLATELET
Abs Immature Granulocytes: 0.05 K/uL (ref 0.00–0.07)
Basophils Absolute: 0.1 K/uL (ref 0.0–0.1)
Basophils Relative: 1 %
Eosinophils Absolute: 0.3 K/uL (ref 0.0–0.5)
Eosinophils Relative: 2 %
HCT: 39.6 % (ref 39.0–52.0)
Hemoglobin: 12.9 g/dL — ABNORMAL LOW (ref 13.0–17.0)
Immature Granulocytes: 0 %
Lymphocytes Relative: 20 %
Lymphs Abs: 2.8 K/uL (ref 0.7–4.0)
MCH: 29.5 pg (ref 26.0–34.0)
MCHC: 32.6 g/dL (ref 30.0–36.0)
MCV: 90.4 fL (ref 80.0–100.0)
Monocytes Absolute: 1 K/uL (ref 0.1–1.0)
Monocytes Relative: 7 %
Neutro Abs: 9.9 K/uL — ABNORMAL HIGH (ref 1.7–7.7)
Neutrophils Relative %: 70 %
Platelets: 376 K/uL (ref 150–400)
RBC: 4.38 MIL/uL (ref 4.22–5.81)
RDW: 12.7 % (ref 11.5–15.5)
WBC: 14.2 K/uL — ABNORMAL HIGH (ref 4.0–10.5)
nRBC: 0 % (ref 0.0–0.2)

## 2024-08-01 LAB — I-STAT CG4 LACTIC ACID, ED: Lactic Acid, Venous: 0.5 mmol/L (ref 0.5–1.9)

## 2024-08-01 NOTE — ED Triage Notes (Signed)
 Pt came in for a spider bite on his left hand from four days ago. Pt also has another sore on his left forearm that has been there for a couple days.

## 2024-08-01 NOTE — ED Notes (Signed)
 Pt advised his mother was on the way and that he had things to do. Pt advised he would return tomorrow. Writer explained to pt the risks of leaving and that it is against medical advice. Writer tried to get pt to stay and be treated, but he was adamant about returning at a different time.
